# Patient Record
Sex: Female | Born: 1935 | Race: White | Hispanic: No | Smoking: Never smoker
Health system: Southern US, Community
[De-identification: ages and names within clinical notes are randomized; demographics above are authoritative.]

## PROBLEM LIST (undated history)

## (undated) DIAGNOSIS — F32A Depression, unspecified: Secondary | ICD-10-CM

## (undated) DIAGNOSIS — Z8719 Personal history of other diseases of the digestive system: Secondary | ICD-10-CM

## (undated) DIAGNOSIS — F329 Major depressive disorder, single episode, unspecified: Secondary | ICD-10-CM

## (undated) DIAGNOSIS — M1990 Unspecified osteoarthritis, unspecified site: Secondary | ICD-10-CM

## (undated) DIAGNOSIS — G629 Polyneuropathy, unspecified: Secondary | ICD-10-CM

## (undated) DIAGNOSIS — R531 Weakness: Secondary | ICD-10-CM

## (undated) DIAGNOSIS — F419 Anxiety disorder, unspecified: Secondary | ICD-10-CM

## (undated) DIAGNOSIS — R0602 Shortness of breath: Secondary | ICD-10-CM

## (undated) DIAGNOSIS — R002 Palpitations: Secondary | ICD-10-CM

## (undated) DIAGNOSIS — I482 Chronic atrial fibrillation, unspecified: Secondary | ICD-10-CM

## (undated) DIAGNOSIS — R6889 Other general symptoms and signs: Secondary | ICD-10-CM

## (undated) DIAGNOSIS — R5383 Other fatigue: Secondary | ICD-10-CM

## (undated) HISTORY — DX: Weakness: R53.1

## (undated) HISTORY — DX: Polyneuropathy, unspecified: G62.9

## (undated) HISTORY — DX: Chronic atrial fibrillation, unspecified: I48.20

## (undated) HISTORY — PX: APPENDECTOMY: SHX54

## (undated) HISTORY — DX: Personal history of other diseases of the digestive system: Z87.19

## (undated) HISTORY — PX: VAGINAL HYSTERECTOMY: SUR661

## (undated) HISTORY — DX: Depression, unspecified: F32.A

## (undated) HISTORY — DX: Major depressive disorder, single episode, unspecified: F32.9

## (undated) HISTORY — DX: Shortness of breath: R06.02

## (undated) HISTORY — DX: Palpitations: R00.2

## (undated) HISTORY — DX: Unspecified osteoarthritis, unspecified site: M19.90

## (undated) HISTORY — PX: TONSILLECTOMY: SUR1361

## (undated) HISTORY — DX: Other general symptoms and signs: R68.89

## (undated) HISTORY — DX: Anxiety disorder, unspecified: F41.9

## (undated) HISTORY — PX: UMBILICAL HERNIA REPAIR: SHX196

## (undated) HISTORY — DX: Other fatigue: R53.83

## (undated) HISTORY — PX: KNEE SURGERY: SHX244

---

## 1997-11-10 ENCOUNTER — Other Ambulatory Visit: Admission: RE | Admit: 1997-11-10 | Discharge: 1997-11-10 | Payer: Self-pay | Admitting: Gynecology

## 1998-04-21 ENCOUNTER — Encounter: Payer: Self-pay | Admitting: Orthopedic Surgery

## 1998-04-29 ENCOUNTER — Encounter: Payer: Self-pay | Admitting: Orthopedic Surgery

## 1998-04-29 ENCOUNTER — Inpatient Hospital Stay (HOSPITAL_COMMUNITY): Admission: RE | Admit: 1998-04-29 | Discharge: 1998-05-04 | Payer: Self-pay | Admitting: Orthopedic Surgery

## 1998-05-04 ENCOUNTER — Inpatient Hospital Stay (HOSPITAL_COMMUNITY)
Admission: RE | Admit: 1998-05-04 | Discharge: 1998-05-11 | Payer: Self-pay | Admitting: Physical Medicine and Rehabilitation

## 1998-05-11 ENCOUNTER — Encounter
Admission: RE | Admit: 1998-05-11 | Discharge: 1998-06-24 | Payer: Self-pay | Admitting: Physical Medicine and Rehabilitation

## 1998-10-22 ENCOUNTER — Encounter: Admission: RE | Admit: 1998-10-22 | Discharge: 1998-10-22 | Payer: Self-pay | Admitting: Gastroenterology

## 1998-11-24 ENCOUNTER — Encounter: Payer: Self-pay | Admitting: Internal Medicine

## 1998-11-24 ENCOUNTER — Ambulatory Visit (HOSPITAL_COMMUNITY): Admission: RE | Admit: 1998-11-24 | Discharge: 1998-11-24 | Payer: Self-pay | Admitting: Internal Medicine

## 1998-12-19 ENCOUNTER — Emergency Department (HOSPITAL_COMMUNITY): Admission: EM | Admit: 1998-12-19 | Discharge: 1998-12-19 | Payer: Self-pay | Admitting: Emergency Medicine

## 1999-02-24 ENCOUNTER — Other Ambulatory Visit: Admission: RE | Admit: 1999-02-24 | Discharge: 1999-02-24 | Payer: Self-pay | Admitting: Gynecology

## 1999-09-06 ENCOUNTER — Encounter: Payer: Self-pay | Admitting: Neurosurgery

## 1999-09-06 ENCOUNTER — Encounter: Admission: RE | Admit: 1999-09-06 | Discharge: 1999-09-06 | Payer: Self-pay | Admitting: Neurosurgery

## 1999-09-11 ENCOUNTER — Encounter: Payer: Self-pay | Admitting: Neurosurgery

## 1999-09-11 ENCOUNTER — Ambulatory Visit (HOSPITAL_COMMUNITY): Admission: RE | Admit: 1999-09-11 | Discharge: 1999-09-11 | Payer: Self-pay | Admitting: Neurosurgery

## 1999-10-05 ENCOUNTER — Encounter: Admission: RE | Admit: 1999-10-05 | Discharge: 1999-10-05 | Payer: Self-pay | Admitting: Neurosurgery

## 1999-10-05 ENCOUNTER — Encounter: Payer: Self-pay | Admitting: Neurosurgery

## 1999-12-26 ENCOUNTER — Encounter: Payer: Self-pay | Admitting: Neurosurgery

## 1999-12-26 ENCOUNTER — Encounter: Admission: RE | Admit: 1999-12-26 | Discharge: 1999-12-26 | Payer: Self-pay | Admitting: Neurosurgery

## 2000-01-21 ENCOUNTER — Encounter: Payer: Self-pay | Admitting: Neurosurgery

## 2000-01-21 ENCOUNTER — Ambulatory Visit (HOSPITAL_COMMUNITY): Admission: RE | Admit: 2000-01-21 | Discharge: 2000-01-21 | Payer: Self-pay | Admitting: Neurosurgery

## 2000-03-22 ENCOUNTER — Encounter (INDEPENDENT_AMBULATORY_CARE_PROVIDER_SITE_OTHER): Payer: Self-pay | Admitting: *Deleted

## 2000-03-22 ENCOUNTER — Ambulatory Visit (HOSPITAL_COMMUNITY): Admission: RE | Admit: 2000-03-22 | Discharge: 2000-03-22 | Payer: Self-pay | Admitting: Gastroenterology

## 2000-03-22 ENCOUNTER — Encounter: Payer: Self-pay | Admitting: Gastroenterology

## 2000-05-03 ENCOUNTER — Encounter: Payer: Self-pay | Admitting: Neurosurgery

## 2000-05-03 ENCOUNTER — Ambulatory Visit (HOSPITAL_COMMUNITY): Admission: RE | Admit: 2000-05-03 | Discharge: 2000-05-03 | Payer: Self-pay | Admitting: Neurosurgery

## 2000-07-02 ENCOUNTER — Other Ambulatory Visit: Admission: RE | Admit: 2000-07-02 | Discharge: 2000-07-02 | Payer: Self-pay | Admitting: Gynecology

## 2001-05-30 ENCOUNTER — Encounter: Admission: RE | Admit: 2001-05-30 | Discharge: 2001-05-30 | Payer: Self-pay | Admitting: Internal Medicine

## 2001-05-30 ENCOUNTER — Encounter: Payer: Self-pay | Admitting: Internal Medicine

## 2001-09-12 ENCOUNTER — Ambulatory Visit: Admission: RE | Admit: 2001-09-12 | Discharge: 2001-09-12 | Payer: Self-pay | Admitting: Internal Medicine

## 2002-04-10 ENCOUNTER — Emergency Department (HOSPITAL_COMMUNITY): Admission: EM | Admit: 2002-04-10 | Discharge: 2002-04-10 | Payer: Self-pay

## 2002-08-07 HISTORY — PX: US ECHOCARDIOGRAPHY: HXRAD669

## 2003-01-30 ENCOUNTER — Ambulatory Visit (HOSPITAL_COMMUNITY): Admission: RE | Admit: 2003-01-30 | Discharge: 2003-01-30 | Payer: Self-pay | Admitting: Neurosurgery

## 2003-02-23 ENCOUNTER — Ambulatory Visit (HOSPITAL_COMMUNITY): Admission: RE | Admit: 2003-02-23 | Discharge: 2003-02-23 | Payer: Self-pay | Admitting: Neurosurgery

## 2003-04-27 ENCOUNTER — Ambulatory Visit (HOSPITAL_COMMUNITY): Admission: RE | Admit: 2003-04-27 | Discharge: 2003-04-27 | Payer: Self-pay | Admitting: Neurosurgery

## 2003-05-19 ENCOUNTER — Encounter: Admission: RE | Admit: 2003-05-19 | Discharge: 2003-05-19 | Payer: Self-pay | Admitting: Internal Medicine

## 2003-08-03 ENCOUNTER — Ambulatory Visit (HOSPITAL_COMMUNITY): Admission: RE | Admit: 2003-08-03 | Discharge: 2003-08-03 | Payer: Self-pay | Admitting: Neurosurgery

## 2003-09-02 ENCOUNTER — Encounter: Admission: RE | Admit: 2003-09-02 | Discharge: 2003-09-02 | Payer: Self-pay | Admitting: Orthopedic Surgery

## 2003-09-03 ENCOUNTER — Ambulatory Visit (HOSPITAL_COMMUNITY): Admission: RE | Admit: 2003-09-03 | Discharge: 2003-09-03 | Payer: Self-pay | Admitting: Orthopedic Surgery

## 2003-09-03 ENCOUNTER — Ambulatory Visit (HOSPITAL_BASED_OUTPATIENT_CLINIC_OR_DEPARTMENT_OTHER): Admission: RE | Admit: 2003-09-03 | Discharge: 2003-09-03 | Payer: Self-pay | Admitting: Orthopedic Surgery

## 2004-03-24 ENCOUNTER — Encounter: Admission: RE | Admit: 2004-03-24 | Discharge: 2004-03-24 | Payer: Self-pay | Admitting: Cardiology

## 2004-06-13 ENCOUNTER — Encounter: Admission: RE | Admit: 2004-06-13 | Discharge: 2004-06-13 | Payer: Self-pay | Admitting: Internal Medicine

## 2004-10-07 ENCOUNTER — Ambulatory Visit: Admission: RE | Admit: 2004-10-07 | Discharge: 2004-10-07 | Payer: Self-pay | Admitting: Orthopedic Surgery

## 2005-01-18 ENCOUNTER — Encounter: Admission: RE | Admit: 2005-01-18 | Discharge: 2005-01-18 | Payer: Self-pay | Admitting: Gastroenterology

## 2005-12-27 ENCOUNTER — Encounter: Admission: RE | Admit: 2005-12-27 | Discharge: 2005-12-27 | Payer: Self-pay | Admitting: *Deleted

## 2006-01-09 ENCOUNTER — Ambulatory Visit (HOSPITAL_COMMUNITY): Admission: RE | Admit: 2006-01-09 | Discharge: 2006-01-09 | Payer: Self-pay | Admitting: Family Medicine

## 2006-04-27 ENCOUNTER — Inpatient Hospital Stay (HOSPITAL_COMMUNITY): Admission: EM | Admit: 2006-04-27 | Discharge: 2006-04-30 | Payer: Self-pay | Admitting: *Deleted

## 2006-04-30 ENCOUNTER — Encounter: Payer: Self-pay | Admitting: Vascular Surgery

## 2006-04-30 ENCOUNTER — Encounter (INDEPENDENT_AMBULATORY_CARE_PROVIDER_SITE_OTHER): Payer: Self-pay | Admitting: Cardiology

## 2006-04-30 ENCOUNTER — Ambulatory Visit: Payer: Self-pay | Admitting: Vascular Surgery

## 2006-07-11 ENCOUNTER — Encounter: Admission: RE | Admit: 2006-07-11 | Discharge: 2006-07-11 | Payer: Self-pay | Admitting: Orthopaedic Surgery

## 2006-07-27 ENCOUNTER — Encounter: Admission: RE | Admit: 2006-07-27 | Discharge: 2006-07-27 | Payer: Self-pay | Admitting: Orthopaedic Surgery

## 2006-09-05 ENCOUNTER — Encounter: Admission: RE | Admit: 2006-09-05 | Discharge: 2006-09-05 | Payer: Self-pay | Admitting: Gastroenterology

## 2006-09-30 ENCOUNTER — Inpatient Hospital Stay (HOSPITAL_COMMUNITY): Admission: EM | Admit: 2006-09-30 | Discharge: 2006-10-03 | Payer: Self-pay | Admitting: *Deleted

## 2007-09-23 ENCOUNTER — Encounter: Admission: RE | Admit: 2007-09-23 | Discharge: 2007-09-23 | Payer: Self-pay | Admitting: Neurology

## 2009-09-02 ENCOUNTER — Inpatient Hospital Stay (HOSPITAL_COMMUNITY): Admission: EM | Admit: 2009-09-02 | Discharge: 2009-09-08 | Payer: Self-pay | Admitting: Emergency Medicine

## 2009-09-21 ENCOUNTER — Ambulatory Visit: Payer: Self-pay | Admitting: Cardiology

## 2010-01-24 ENCOUNTER — Ambulatory Visit: Payer: Self-pay | Admitting: Cardiology

## 2010-01-27 ENCOUNTER — Inpatient Hospital Stay (HOSPITAL_COMMUNITY)
Admission: EM | Admit: 2010-01-27 | Discharge: 2010-02-02 | Payer: Self-pay | Source: Home / Self Care | Attending: Internal Medicine | Admitting: Internal Medicine

## 2010-01-29 ENCOUNTER — Encounter: Payer: Self-pay | Admitting: Neurosurgery

## 2010-01-30 ENCOUNTER — Encounter: Payer: Self-pay | Admitting: Gastroenterology

## 2010-01-30 ENCOUNTER — Encounter (HOSPITAL_BASED_OUTPATIENT_CLINIC_OR_DEPARTMENT_OTHER): Payer: Self-pay | Admitting: Internal Medicine

## 2010-01-30 ENCOUNTER — Encounter: Payer: Self-pay | Admitting: Internal Medicine

## 2010-01-31 LAB — URINALYSIS, ROUTINE W REFLEX MICROSCOPIC
Bilirubin Urine: NEGATIVE
Hgb urine dipstick: NEGATIVE
Ketones, ur: NEGATIVE mg/dL
Specific Gravity, Urine: 1.007 (ref 1.005–1.030)
Urobilinogen, UA: 0.2 mg/dL (ref 0.0–1.0)

## 2010-01-31 LAB — CBC
HCT: 35.9 % — ABNORMAL LOW (ref 36.0–46.0)
Hemoglobin: 11.6 g/dL — ABNORMAL LOW (ref 12.0–15.0)
MCH: 31.4 pg (ref 26.0–34.0)
MCHC: 32.8 g/dL (ref 30.0–36.0)
MCHC: 32.3 g/dL (ref 30.0–36.0)
MCV: 95.9 fL (ref 78.0–100.0)
Platelets: 144 10*3/uL — ABNORMAL LOW (ref 150–400)
RBC: 3.73 MIL/uL — ABNORMAL LOW (ref 3.87–5.11)
RDW: 15.5 % (ref 11.5–15.5)
WBC: 4.9 10*3/uL (ref 4.0–10.5)

## 2010-01-31 LAB — DIFFERENTIAL
Basophils Relative: 0 % (ref 0–1)
Basophils Relative: 0 % (ref 0–1)
Eosinophils Absolute: 0.1 10*3/uL (ref 0.0–0.7)
Eosinophils Relative: 3 % (ref 0–5)
Lymphocytes Relative: 26 % (ref 12–46)
Lymphs Abs: 1.2 10*3/uL (ref 0.7–4.0)
Monocytes Absolute: 0.8 10*3/uL (ref 0.1–1.0)
Monocytes Absolute: 0.7 10*3/uL (ref 0.1–1.0)
Monocytes Relative: 16 % — ABNORMAL HIGH (ref 3–12)
Monocytes Relative: 13 % — ABNORMAL HIGH (ref 3–12)
Neutro Abs: 2.8 10*3/uL (ref 1.7–7.7)

## 2010-01-31 LAB — COMPREHENSIVE METABOLIC PANEL
ALT: 18 U/L (ref 0–35)
AST: 23 U/L (ref 0–37)
Alkaline Phosphatase: 78 U/L (ref 39–117)
Alkaline Phosphatase: 81 U/L (ref 39–117)
BUN: 13 mg/dL (ref 6–23)
CO2: 28 mEq/L (ref 19–32)
Calcium: 9.7 mg/dL (ref 8.4–10.5)
Chloride: 104 mEq/L (ref 96–112)
Chloride: 107 mEq/L (ref 96–112)
Creatinine, Ser: 0.81 mg/dL (ref 0.4–1.2)
GFR calc Af Amer: >60 mL/min (ref >60)
GFR calc non Af Amer: 55 mL/min — ABNORMAL LOW (ref >60)
Glucose, Bld: 112 mg/dL — ABNORMAL HIGH (ref 70–99)
Potassium: 3.9 mEq/L (ref 3.5–5.1)
Potassium: 3.8 mEq/L (ref 3.5–5.1)
Sodium: 140 mEq/L (ref 135–145)
Total Bilirubin: 1.2 mg/dL (ref 0.3–1.2)
Total Protein: 7 g/dL (ref 6.0–8.3)

## 2010-01-31 LAB — POCT I-STAT, CHEM 8
BUN: 17 mg/dL (ref 6–23)
Calcium, Ion: 1.25 mmol/L (ref 1.12–1.32)
Chloride: 104 mEq/L (ref 96–112)
Glucose, Bld: 112 mg/dL — ABNORMAL HIGH (ref 70–99)
TCO2: 30 mmol/L (ref 0–100)

## 2010-01-31 LAB — LIPID PANEL: VLDL: 19 mg/dL (ref 0–40)

## 2010-01-31 LAB — TSH: TSH: 1.331 u[IU]/mL (ref 0.350–4.500)

## 2010-01-31 LAB — PROTIME-INR
INR: 1.09 (ref 0.00–1.49)
Prothrombin Time: 14.5 seconds (ref 11.6–15.2)
Prothrombin Time: 14.3 seconds (ref 11.6–15.2)

## 2010-01-31 LAB — HEMOGLOBIN A1C
Hgb A1c MFr Bld: 6.1 % — ABNORMAL HIGH (ref <5.7)
Mean Plasma Glucose: 128 mg/dL — ABNORMAL HIGH (ref <117)

## 2010-01-31 LAB — GLUCOSE, CAPILLARY

## 2010-01-31 LAB — CK TOTAL AND CKMB (NOT AT ARMC)
Relative Index: INVALID (ref 0.0–2.5)
Total CK: 96 U/L (ref 7–177)

## 2010-01-31 LAB — APTT: aPTT: 29 seconds (ref 24–37)

## 2010-01-31 LAB — TROPONIN I: Troponin I: 0.05 ng/mL (ref 0.00–0.06)

## 2010-02-01 LAB — HEPARIN LEVEL (UNFRACTIONATED)
Heparin Unfractionated: 0.25 IU/mL — ABNORMAL LOW (ref 0.30–0.70)
Heparin Unfractionated: 0.23 IU/mL — ABNORMAL LOW (ref 0.30–0.70)
Heparin Unfractionated: 0.29 IU/mL — ABNORMAL LOW (ref 0.30–0.70)

## 2010-02-01 LAB — CBC
Platelets: 145 10*3/uL — ABNORMAL LOW (ref 150–400)
Platelets: 151 10*3/uL (ref 150–400)
RBC: 3.61 MIL/uL — ABNORMAL LOW (ref 3.87–5.11)
RDW: 15.5 % (ref 11.5–15.5)
WBC: 5.6 10*3/uL (ref 4.0–10.5)
WBC: 6.5 10*3/uL (ref 4.0–10.5)

## 2010-02-01 LAB — PROTIME-INR: INR: 1.27 (ref 0.00–1.49)

## 2010-02-02 LAB — PROTIME-INR
INR: 1.21 (ref 0.00–1.49)
Prothrombin Time: 15.5 seconds — ABNORMAL HIGH (ref 11.6–15.2)

## 2010-02-02 LAB — CBC
Hemoglobin: 11.8 g/dL — ABNORMAL LOW (ref 12.0–15.0)
MCH: 31.4 pg (ref 26.0–34.0)
MCHC: 33 g/dL (ref 30.0–36.0)
Platelets: 157 10*3/uL (ref 150–400)
RBC: 3.76 MIL/uL — ABNORMAL LOW (ref 3.87–5.11)
RBC: 3.81 MIL/uL — ABNORMAL LOW (ref 3.87–5.11)
WBC: 6.5 10*3/uL (ref 4.0–10.5)

## 2010-02-02 LAB — HEPARIN LEVEL (UNFRACTIONATED): Heparin Unfractionated: 0.36 IU/mL (ref 0.30–0.70)

## 2010-02-16 NOTE — H&P (Signed)
[name], [name]         ACCOUNT NO.:  [id number]  MEDICAL RECORD NO.:  [id number]          PATIENT TYPE:  INP  LOCATION:  3016                         FACILITY:  [facility]  PHYSICIAN:  Barry Dienes. Eloise Harman, M.D.DATE OF BIRTH:  1911/01/12  DATE OF ADMISSION:  01/27/2010 DATE OF DISCHARGE:                             HISTORY & PHYSICAL   CHIEF COMPLAINT:  Cannot speak.  HISTORY OF PRESENT ILLNESS:  The patient is a 77 year old white woman who is well known to me.  She is a resident of the [facility], who today pulled her call button in her room, was found mute with a right facial droop, and was transferred to the emergency room via ambulance.  She has a history of chronic atrial fibrillation and had been on Pradaxa until a GI bleed in October 2001. At that time, due to her concern about a re-bleed and her frustration with dose adjustments with Coumadin, she opted to switch to aspirin once daily.  Currently, she cannot provide more recent history due to expressive aphasia.  PAST MEDICAL HISTORY: 1. Hypothyroidism. 2. Hiatal hernia. 3. Gastroesophageal reflux. 4. Chronic low back pain with spinal stenosis at L3-4. 5. Chronic atrial fibrillation. 6. Osteoporosis with T12-L1 compression fractures. 7. Osteoarthritis. 8. Hemorrhoids. 9. Vague history of stroke. 10.Gait unsteadiness, for which she uses a walker. 11.Chronic fatigue. 12.Cough. 13.Congestive heart failure.  MEDICATIONS AT THAT TIME OF ADMISSION: 1. Ecotrin 81 mg p.o. daily. 2. GlycoLax  17 g p.o. daily p.r.n. constipation. 3. Zydone 5/325 one tab p.o. 4 times daily as needed for moderate     pain. 4. Alprazolam 0.25 mg p.o. at bedtime p.r.n. sleep or anxiety. 5. Gabapentin 300 mg p.o. t.i.d. 6. Hydrochlorothiazide 25 mg p.o. daily. 7. Lisinopril 20 mg p.o. daily. 8. Meloxicam 15 mg p.o. daily. 9. Percocet 5/325 one tab p.o. t.i.d. as needed for severe pain. 10.Protonix 40 mg  daily. 11.Synthroid 75 mcg daily.  ALLERGIES:  CYMBALTA.  PHYSICIANS INVOLVED IN CARE:  Colleen Can. Deborah Chalk, MD; Llana Aliment. Randa Evens, MD; and Dr. Morrell Riddle.  PAST SURGICAL HISTORY:  Remote appendectomy, remote umbilical hernia repair, left total knee replacement, and bilateral cataract operations.  SOCIAL HISTORY:  She is a widow and lives at the independent living section of [place].  She is retired.  She has no history of tobacco or alcohol abuse.  FAMILY HISTORY:  Her father died at age 32 from cancer.  Her mother died at age 31.  She had 5 brothers who have died and there is one sister who is alive.  The family history is positive for premature cardiovascular disease, diabetes mellitus, and colon cancer.  She has 2 sons and 2 daughters.  REVIEW OF SYSTEMS:  She has chronic mild-to-moderate low back pain and generally walks leaning forward using a walker for stability.  She has not had recent problems with shortness of breath or chest pain or nausea or vomiting.  Currently, she is unable to speak.  CURRENT PHYSICAL EXAMINATION:  VITAL SIGNS:  Blood pressure 170/90, pulse 100, respirations 20, temperature 98.9, and pulse oxygen saturation was 97% on room air. GENERAL:  She is an elderly white  woman who had a right facial droop and was in no apparent distress while lying partially upright in bed. HEAD, EYES, EARS, NOSE, AND THROAT:  Significant for a moderate right facial droop. NECK:  Supple and without jugular venous distention or carotid bruit. CHEST:  Clear to auscultation. HEART:  Irregularly irregular rhythm without significant murmur. ABDOMEN:  Normal bowel sounds.  No hepatosplenomegaly or tenderness. EXTREMITIES:  Without cyanosis, clubbing, or edema. NEUROLOGIC:  She is alert and mute.  She had a right facial droop.  She was able to move all extremities well and would not follow commands.  INITIAL LABORATORY AND DIAGNOSTIC STUDIES:  EKG showed  atrial fibrillation at a rate of 69.  Serum sodium was 142, potassium 3.9, chloride 104, glucose 112, BUN 17, and creatinine 1.1.  White blood cell count was 4.8, hemoglobin 11.6, hematocrit 35.4, and platelets 144. Liver associated enzymes were normal.  Total protein 7.1, albumin 3.9, and calcium 9.7.  Troponin I was 0.04.  Urinalysis was nitrite negative. CPK total was 96 with MB 4.5.  Hemoglobin A1c was 6.1%.  CT scan of the brain without contrast showed no acute intracranial infarction, hemorrhage, or mass lesion with extensive periventricular white matter lucency consistent with chronic small vessel ischemic disease and there were several tiny old lacunar infarcts in the basal ganglia.  An MRI exam of the brain done without contrast showed an acute infarct in the left frontal temporal lobe with chronic ischemic change in the pons and basal ganglia bilaterally.  An MRA exam of the brain showed mild intracranial atherosclerotic disease without large vessel occlusion.  IMPRESSION AND PLAN: 1. Acute stroke:  She has had a left brain stroke, likely from cardiac     embolism.  Unfortunately, because of her recent gastrointestinalbleed, she opted to not continue Pradaxa or Coumadin and switched     to aspirin with slightly lessened effectiveness with stroke     prophylaxis with atrial fibrillation.  She has fairly preserved     right-sided strength; however, she has expressive aphasia.  She     likely has some degree of dysphagia, and we will hold feeding and     pill administration until she has been cleared by speech language     pathologist.  We will also have her see a physical therapist,     occupational therapist, and obtain other stroke diagnostic tests     including an echocardiogram and a carotid ultrasound exam.  She     likely will need skilled nursing facility rehabilitation.  Should     she have significant dysphagia, we will need to consider the     possibility of PEG tube  placement.  During her last admission, she     had opted for a do not resuscitate status.  We will continue a do     not resuscitate     status per her previous wishes as she cannot speak at this time. 2. Atrial fibrillation:  Stable on current medications. 3. Hypothyroidism:  Clinically stable and we will recheck a TSH level.          ______________________________ Barry Dienes. Eloise Harman, M.D.     DGP/MEDQ  D:  01/29/2010  T:  01/29/2010  Job:  [id number]  Electronically Signed by Jarome Matin M.D. on 02/16/2010 07:56:02 AM

## 2010-02-16 NOTE — Consult Note (Signed)
[name], [name]         ACCOUNT NO.:  [id number]  MEDICAL RECORD NO.:  [id number]          PATIENT TYPE:  INP  LOCATION:  3016                         FACILITY:  [facility]  PHYSICIAN:  Dr. Terrace Arabia             .DATE OF BIRTH:  [birth year]  DATE OF CONSULTATION:  01/27/2010 DATE OF DISCHARGE:                                CONSULTATION   REASON FOR CONSULTATION:  Stroke.  HISTORY OF PRESENT ILLNESS:  This is a pleasant 77 year old female with past medical history of hypothyroidism, hiatal hernia, GERD, chronic low back pain, chronic atrial fibrillation, previously on Coumadin for the past 5-6 years.  However, recently was taken off Coumadin, placed on Pradaxa for 2-3 days but then suffered rectal bleeding and has been on aspirin since that point in time.  The patient lives in independent living center where she takes care of her daily living and lives fairly independent.  The patient was last seen normal at 8:30 p.m. on August 26, 2009.  At approximately 9:45 in the morning, the patient pushed her help button and was found standing at her sink mute.  Her family members were in the room.  When EMS go to the scene, she was clenching onto the sink.  It was very difficult to get her in the stretcher.  It was immediately noticeable that the patient had a left gaze preference, right facial droop and was mute, however she was moving all 4 extremities.  The patient was brought in the [facility] Emergency Department where a CT scan was obtained and she was found to have no acute infarct.  The patient remains mute with a left gaze preference, right hemianopsia, right facial droop and appears to be moving her left side more than her right.  PAST SURGICAL HISTORY:  Appendectomy, umbilical hernia repair, left TKA, bilateral iridectomy and cataract repair.  PAST MEDICAL HISTORY:  Chronic fatigue, cough, congestive heart failure, hiatal hernia, GERD, chronic low back pain, spinal stenosis at  L3-4, AFib, as stated recently on Coumadin and Pradaxa, however secondary to rectal bleeding, has only been on Ecotrin 325 daily, T12 and L1 fracture.  MEDICATIONS:  She is on amoxicillin 500 mg, hydrochlorothiazide, lisinopril, meloxicam, methocarbamol, pantoprazole, Synthroid, alprazolam, gabapentin, Norco, Poly-Iron, Spiriva, and Ecotrin.  ALLERGIES:  She is allergic to BONIVA, FENTANYL, and LYRICA.  SOCIAL HISTORY:  She does not smoke, drink or do illicit drugs.  She is widowed.  She lives fairly independent.  REVIEW OF SYSTEMS:  Unobtainable as the patient is mute.  PHYSICAL EXAMINATION:  Blood pressure is 176/76, pulse 119, respirations 12.  She is alert, but she is unable to communicate.  She is very agitated at this time.  She moves her left extremity greater than her right extremity.  I cannot get her to do simple commands such as show me her thumb or close her eyes.  She does seem to track me as I cross the room.  Pupils are equal, round and reactive to light and accommodating. She does have a left gaze forced deviation with a right homonymous hemianopsia.  She has a positive facial droop at the  corner of her mouth and nasolabial fold and she is mute.  Facial sensation appears to be decreased to pinprick on the right lower portion of her upper jaw.  At this point she is moving bilateral upper extremities, left greater than right but appears to have 4/5 strength.  Grips are equal bilaterally. The patient is moving her lower extremities spontaneously but not to command.  She withdraws briskly to painful stimuli at the toes.  As far as sensation, the patient's left upper extremity shows brisk response to painful stimulation, her right upper extremity shows decreased response to painful stimulation and bilateral toes show brisk response to nail bed pressure.  It is difficult to assess ataxia at the time but when moving, I did not see any ataxic movements and she is sitting up  in the bed, agitated and shows no truncal ataxia at this time.  LABORATORY FINDINGS:  UA is negative.  Sodium 140, potassium 2.9, chloride 104, CO2 28, BUN 16, creatinine 0.95, glucose 113, white blood cell count 4.8,  platelets 144, hemoglobin 11.6, hematocrit 35.4.  IMAGING:  CT of head is negative for any mass, bleed or infarct.  Does shows moderate atrophy.  ASSESSMENT:  This is a 77 year old Caucasian female with a history of atrial fibrillation, questionable transient ischemic attacks in the past, recently on Coumadin and then Pradaxa, but both were removed secondary to rectal bleeding.  She has been on Ecotrin 325 since September 08, 2009.  The patient now presents to the emergency room with right hemianopsia, right facial droop, left gaze deviation and mute.  This likely will represent a left brain CVA from a cardioembolic source.  We will continue with a stroke workup to look for source while she is in the hospital and stroke MD will follow in the morning.  RECOMMENDATIONS:  MRI, MRA of brain, 2-D echo, carotid Doppler, fasting lipid pane, HbA1c, PT, OT, speech therapy, n.p.o. until passes stroke swallow screen.     Felicie Morn, PA-C   ______________________________ Dr. Terrace Arabia    DS/MEDQ  D:  01/27/2010  T:  01/28/2010  Job:  [id number]  cc:   Levert Feinstein, MD  Electronically Signed by Felicie Morn PA-C on 01/28/2010 03:24:41 PM Electronically Signed by Jarome Matin M.D. on 02/16/2010 07:55:59 AM

## 2010-02-16 NOTE — Discharge Summary (Signed)
[name], [name]         ACCOUNT NO.:  [id number]  MEDICAL RECORD NO.:  [id number]          PATIENT TYPE:  INP  LOCATION:  3016                         FACILITY:  [facility]  PHYSICIAN:  Barry Dienes. Eloise Harman, M.D.DATE OF BIRTH:  November 26, 1911  DATE OF ADMISSION:  01/27/2010 DATE OF DISCHARGE:                              DISCHARGE SUMMARY   ANTICIPATED DATE OF DISCHARGE:  February 02, 2010.  PERTINENT FINDINGS:  The patient is a 77 year old white woman who is well known to me.  She has been a resident of the [facility] independent living facility, and on the day of admission she pulled her call button in her room and was found to be with a right facial droop, so she was transferred to the emergency room via ambulance.  She has a history of chronic atrial fibrillation and had been on Pradaxa until a GI bleed in October 2011.  At a followup visit from that hospitalization, due to concern about a recurrent GI bleed and herfrustration with dose adjustments with Coumadin, she opted to switch to aspirin once daily as stroke prophylaxis rather than use Pradaxa or Coumadin.  At the time of my evaluation, she could not provide more recent history due to expressive aphasia.  PAST MEDICAL HISTORY:  Hypothyroidism, gastroesophageal reflux and hiatal hernia, chronic low back pain with spinal stenosis at the L3-4 level, chronic atrial fibrillation, osteoporosis with T12 and L1 compression fractures, osteoarthritis, hemorrhoids, vague history of stroke, gait unsteadiness for which she uses a walker, chronic fatigue, cough, and congestive heart failure.  MEDICATIONS:  At the time of hospital admission 1. Ecotrin 81 mg daily. 2. GlycoLax 17 g daily p.r.n. constipation. 3. Zydone 5/325 one tablet p.o. every 4 hours as needed for moderate     pain. 4. Alprazolam 0.25 mg at bedtime as needed for sleep or anxiety. 5. Gabapentin 300 mg p.o. t.i.d. 6. Hydrochlorothiazide 25 mg p.o. daily. 7.  Lisinopril 20 mg p.o. daily. 8. Meloxicam 15 mg p.o. daily. 9. Percocet 5/325 one tablet p.o. t.i.d. as needed for severe pain. 10.Protonix 40 mg daily. 11.Synthroid 75 mcg daily.  ALLERGIES:  CYMBALTA.  See admit history and physical for details of physicians involved in care, past surgical history, social history, family history, and review of systems.  INITIAL PHYSICAL EXAM:  VITAL SIGNS:  Blood pressure 170/90, pulse 100, respirations 20, temperature 98.9, pulse oxygen saturation was 97% on room air.  GENERAL:  The patient is an elderly white woman who had right facial droop and was in no apparent distress while lying partially upright in bed. HEAD, EYES, EARS, NOSE AND THROAT:  Significant for moderate right-sided facial droop. NECK:  Supple without jugular venous distention or carotid bruit. CHEST:  Clear to auscultation. HEART:  An irregularly irregular rhythm without significant murmur. ABDOMEN:  Normal bowel sounds and no hepatosplenomegaly or tenderness. EXTREMITIES:  Without cyanosis, clubbing, or edema. NEUROLOGIC:  She was alert and mute.  She had a moderate size right facial droop.  She was able to move all extremities well and would not follow commands.  INITIAL LABORATORY STUDIES:  EKG showed atrial fibrillation at a ventricular rate of 69.  Serum  sodium was 142, potassium 3.9, chloride 104, glucose 112, BUN 17, creatinine 1.1.  White blood cell count was 4.8, hemoglobin 11.6, hematocrit 35.4, and platelets 144.  Liver- associated enzymes were normal.  Total protein 7.1, albumin 3.9, calcium 9.7.  Troponin-I level was 0.04.  Urinalysis was nitrite negative.  CPK total was 96 with MB 4.5.  Hemoglobin A1c was 6.1%.  A CT scan of the brain without IV contrast showed no acute intracranial infection, hemorrhage, or mass lesion.  There were extensive periventricular white matter lucencies consistent with chronic small-vessel ischemic disease, and there were several  tiny old lacunar infarcts in the basal ganglia. Subsequently, an MRI exam of the brain was done without IV contrast that showed an acute infarct in the left frontotemporal lobe with chronic ischemic change in the pons and basal ganglia bilaterally.  An MRA exam of the brain showed mild intracranial atherosclerotic disease without large vessel occlusion.  HOSPITAL COURSE:  The patient was followed closely by physical therapy and occupational therapy and speech therapy consultants as well as a neurology Research scientist (medical).  Her initial swallow test showed that she was not able to swallow any food consistently safely at all, so she was changed to an n.p.o. status and continued with IV fluids.  She had a carotid ultrasound exam that showed no significant extracranial carotid artery stenosis.  She had a followup modified barium swallow done on January 23 that showed significant improvement with moderate oropharyngeal dysphagia with gross aspiration of thin liquids.  A trial of controlled volume of bolus via teaspoon was without aspiration, with thickened nectar-thick liquids.  Accordingly, the speech pathologist recommended using a dysphagia-1 and nectar-thick liquids diet.  Liquids should be given by teaspoon only, and there should be no use of cups or sipping. Medicines should be crushed, and she requires full support during feeding.  A transthoracic echocardiogram is to be done today, with results pending at the time of dictation.  PROCEDURES:  CT scan of the head without IV contrast, MRI of the head without IV contrast, MRA of the brain, modified barium swallow on January 20, modified barium swallow on January 23, and transthoracic echocardiogram on January 23.  COMPLICATIONS:  None.  CONDITION ON DISCHARGE:  She remains frustrated by difficulty communicating, although she is using a message board for people with aphasia, to some effect.  MOST RECENT PHYSICAL EXAMINATION:  VITAL SIGNS:   Blood pressure 156/77, pulse 78, respirations 16, temperature 98.1, pulse oxygen saturation 96% on room air.  GENERAL:  She is an elderly white woman who is in no apparent distress. HEAD, EYES, EARS, NOSE AND THROAT:  Within normal limits with the exception of the mild right-sided facial droop. NECK:  Without jugular venous distention or carotid bruit. CHEST:  Clear to auscultation. HEART:  An irregularly irregular rhythm. ABDOMEN:  Normal bowel sounds and no hepatosplenomegaly or tenderness. EXTREMITIES:  Without cyanosis, clubbing, or edema, and the pedal pulses were palpable. NEUROLOGIC:  This is most significant for a mild right-sided facial droop and expressive aphasia.  DISCHARGE DIAGNOSES: 1. Acute left brain stroke with resultant expressive aphasia. 2. Chronic atrial fibrillation for which she declined Coumadin and     uses aspirin for stroke prophylaxis. 3. Dyslipidemia. 4. Stroke prophylaxis. 5. Constipation. 6. Anticoagulation. 7. Osteoarthritis. 8. Anxiety. 9. Chronic low back pain. 10.Hypertension. 11.Constipation. 12.Iron deficiency anemia.  DISCHARGE MEDICATIONS: 1. Lovenox 30 mg subcutaneous once daily. 2. Fleet enema once daily as needed. 3. Simvastatin 5 mg p.o. daily. 4. Thick-It food  thickener, use as directed to achieve nectar-thick     liquids. 5. Warfarin 2.5 mg by mouth once daily. 6. Tylenol 325 mg, take 1 tablet p.o. every 4 hours as needed for pain     or temperature greater than 100 degrees Fahrenheit. 7. Xanax 0.25 mg, take 1 tablet p.o. q.i.d. p.r.n. anxiety. 8. Aspirin uncoated 325 mg p.o. once daily. 9. Calcium 1 tablet p.o. twice daily. 10.Gabapentin, take two caps p.o. every morning and 2 capsules p.o. at     bedtime. 11.Hydrochlorothiazide 25 mg p.o. daily. 12.Vicodin 5/325, take 1 tablet p.o. q.i.d. p.r.n. moderate pain. 13.Nu-Iron 150 one cap p.o. daily. 14.Lisinopril 20 mg p.o. daily. 15.MiraLax 17 g p.o. daily. 16.Protonix 40 mg  p.o. once daily. 17.Spiriva 18 mcg inhalation once daily. 18.Synthroid 75 mcg p.o. daily.  DISPOSITION AND FOLLOWUP:  The patient will be transferred to a skilled nursing facility for continued care of her gait instability and expressive aphasia.  When she completes her time in the skilled nursing facility and returns to a more community setting, she should call [phone] to arrange a followup visit with Dr. Jarome Matin at [facility] within 2 weeks of this discharge.  Please note that the process of discharge took 35 minutes.         ______________________________ Barry Dienes Eloise Harman, M.D.    DGP/MEDQ  D:  02/01/2010  T:  02/02/2010  Job:  [id number]  Electronically Signed by Jarome Matin M.D. on 02/16/2010 07:55:56 AM

## 2010-03-25 LAB — CBC
HCT: 24.9 % — ABNORMAL LOW (ref 36.0–46.0)
HCT: 25.9 % — ABNORMAL LOW (ref 36.0–46.0)
HCT: 29.1 % — ABNORMAL LOW (ref 36.0–46.0)
HCT: 29.1 % — ABNORMAL LOW (ref 36.0–46.0)
Hemoglobin: 8.1 g/dL — ABNORMAL LOW (ref 12.0–15.0)
Hemoglobin: 8.4 g/dL — ABNORMAL LOW (ref 12.0–15.0)
Hemoglobin: 9 g/dL — ABNORMAL LOW (ref 12.0–15.0)
MCH: 32.7 pg (ref 26.0–34.0)
MCH: 33 pg (ref 26.0–34.0)
MCH: 33.2 pg (ref 26.0–34.0)
MCH: 33.2 pg (ref 26.0–34.0)
MCHC: 33 g/dL (ref 30.0–36.0)
MCHC: 33 g/dL (ref 30.0–36.0)
MCHC: 33.1 g/dL (ref 30.0–36.0)
MCHC: 33.3 g/dL (ref 30.0–36.0)
MCHC: 33.3 g/dL (ref 30.0–36.0)
MCHC: 33.5 g/dL (ref 30.0–36.0)
MCHC: 33.6 g/dL (ref 30.0–36.0)
MCV: 100.1 fL — ABNORMAL HIGH (ref 78.0–100.0)
MCV: 99 fL (ref 78.0–100.0)
MCV: 99.5 fL (ref 78.0–100.0)
MCV: 99.9 fL (ref 78.0–100.0)
Platelets: 111 10*3/uL — ABNORMAL LOW (ref 150–400)
Platelets: 112 10*3/uL — ABNORMAL LOW (ref 150–400)
Platelets: 113 10*3/uL — ABNORMAL LOW (ref 150–400)
Platelets: 115 10*3/uL — ABNORMAL LOW (ref 150–400)
Platelets: 126 10*3/uL — ABNORMAL LOW (ref 150–400)
RBC: 2.29 MIL/uL — ABNORMAL LOW (ref 3.87–5.11)
RBC: 2.53 MIL/uL — ABNORMAL LOW (ref 3.87–5.11)
RBC: 2.75 MIL/uL — ABNORMAL LOW (ref 3.87–5.11)
RDW: 15.6 % — ABNORMAL HIGH (ref 11.5–15.5)
RDW: 15.8 % — ABNORMAL HIGH (ref 11.5–15.5)
RDW: 16.1 % — ABNORMAL HIGH (ref 11.5–15.5)
RDW: 16.4 % — ABNORMAL HIGH (ref 11.5–15.5)
RDW: 16.5 % — ABNORMAL HIGH (ref 11.5–15.5)
RDW: 17.5 % — ABNORMAL HIGH (ref 11.5–15.5)
WBC: 5.3 10*3/uL (ref 4.0–10.5)
WBC: 5.8 10*3/uL (ref 4.0–10.5)
WBC: 6.6 10*3/uL (ref 4.0–10.5)
WBC: 6.7 10*3/uL (ref 4.0–10.5)

## 2010-03-25 LAB — BASIC METABOLIC PANEL
BUN: 11 mg/dL (ref 6–23)
BUN: 15 mg/dL (ref 6–23)
BUN: 19 mg/dL (ref 6–23)
CO2: 23 mEq/L (ref 19–32)
CO2: 25 mEq/L (ref 19–32)
Calcium: 8.5 mg/dL (ref 8.4–10.5)
Calcium: 8.8 mg/dL (ref 8.4–10.5)
Calcium: 8.8 mg/dL (ref 8.4–10.5)
Calcium: 8.9 mg/dL (ref 8.4–10.5)
Creatinine, Ser: 0.74 mg/dL (ref 0.4–1.2)
Creatinine, Ser: 0.77 mg/dL (ref 0.4–1.2)
Creatinine, Ser: 0.97 mg/dL (ref 0.4–1.2)
GFR calc Af Amer: >60 mL/min (ref >60)
GFR calc Af Amer: >60 mL/min (ref >60)
GFR calc non Af Amer: >60 mL/min (ref >60)
GFR calc non Af Amer: >60 mL/min (ref >60)
GFR calc non Af Amer: >60 mL/min (ref >60)
Glucose, Bld: 102 mg/dL — ABNORMAL HIGH (ref 70–99)
Glucose, Bld: 111 mg/dL — ABNORMAL HIGH (ref 70–99)
Potassium: 4.7 mEq/L (ref 3.5–5.1)
Sodium: 140 mEq/L (ref 135–145)

## 2010-03-25 LAB — HEMOCCULT GUIAC POC 1CARD (OFFICE)
Fecal Occult Bld: NEGATIVE
Fecal Occult Bld: POSITIVE
Fecal Occult Bld: POSITIVE

## 2010-03-25 LAB — PREPARE FRESH FROZEN PLASMA

## 2010-03-25 LAB — PROTIME-INR
INR: 1.34 (ref 0.00–1.49)
INR: 2.94 — ABNORMAL HIGH (ref 0.00–1.49)
Prothrombin Time: 16.8 seconds — ABNORMAL HIGH (ref 11.6–15.2)
Prothrombin Time: 19 seconds — ABNORMAL HIGH (ref 11.6–15.2)
Prothrombin Time: 30.7 seconds — ABNORMAL HIGH (ref 11.6–15.2)

## 2010-03-25 LAB — HEPATIC FUNCTION PANEL
Bilirubin, Direct: 0.2 mg/dL (ref 0.0–0.3)
Indirect Bilirubin: 0.6 mg/dL (ref 0.3–0.9)

## 2010-03-25 LAB — URINALYSIS, ROUTINE W REFLEX MICROSCOPIC
Bilirubin Urine: NEGATIVE
Glucose, UA: NEGATIVE mg/dL
Ketones, ur: NEGATIVE mg/dL
pH: 5.5 (ref 5.0–8.0)

## 2010-03-25 LAB — TYPE AND SCREEN: ABO/RH(D): O POS

## 2010-03-25 LAB — COMPREHENSIVE METABOLIC PANEL
ALT: 14 U/L (ref 0–35)
AST: 15 U/L (ref 0–37)
Alkaline Phosphatase: 50 U/L (ref 39–117)
CO2: 27 mEq/L (ref 19–32)
Calcium: 8.5 mg/dL (ref 8.4–10.5)
GFR calc Af Amer: >60 mL/min (ref >60)
Potassium: 3.7 mEq/L (ref 3.5–5.1)
Sodium: 141 mEq/L (ref 135–145)
Total Protein: 5.6 g/dL — ABNORMAL LOW (ref 6.0–8.3)

## 2010-03-25 LAB — DIFFERENTIAL
Basophils Absolute: 0 10*3/uL (ref 0.0–0.1)
Basophils Relative: 0 % (ref 0–1)
Eosinophils Absolute: 0.1 10*3/uL (ref 0.0–0.7)
Eosinophils Relative: 1 % (ref 0–5)
Monocytes Absolute: 0.6 10*3/uL (ref 0.1–1.0)

## 2010-03-25 LAB — POCT I-STAT, CHEM 8
BUN: 39 mg/dL — ABNORMAL HIGH (ref 6–23)
Hemoglobin: 10.2 g/dL — ABNORMAL LOW (ref 12.0–15.0)
Potassium: 4.3 mEq/L (ref 3.5–5.1)
Sodium: 140 mEq/L (ref 135–145)
TCO2: 22 mmol/L (ref 0–100)

## 2010-03-25 LAB — URINE MICROSCOPIC-ADD ON

## 2010-03-25 LAB — URINE CULTURE

## 2010-03-25 LAB — TROPONIN I: Troponin I: 0.04 ng/mL (ref 0.00–0.06)

## 2010-03-25 LAB — CK TOTAL AND CKMB (NOT AT ARMC): Relative Index: INVALID (ref 0.0–2.5)

## 2010-05-07 ENCOUNTER — Emergency Department (HOSPITAL_COMMUNITY)
Admission: EM | Admit: 2010-05-07 | Discharge: 2010-05-07 | Disposition: A | Discharge status: Home / Self Care | Payer: Medicare Other | Attending: Emergency Medicine | Admitting: Emergency Medicine

## 2010-05-07 DIAGNOSIS — G589 Mononeuropathy, unspecified: Secondary | ICD-10-CM | POA: Insufficient documentation

## 2010-05-07 DIAGNOSIS — I4891 Unspecified atrial fibrillation: Secondary | ICD-10-CM | POA: Insufficient documentation

## 2010-05-07 DIAGNOSIS — Y92009 Unspecified place in unspecified non-institutional (private) residence as the place of occurrence of the external cause: Secondary | ICD-10-CM | POA: Insufficient documentation

## 2010-05-07 DIAGNOSIS — S139XXA Sprain of joints and ligaments of unspecified parts of neck, initial encounter: Secondary | ICD-10-CM | POA: Insufficient documentation

## 2010-05-07 DIAGNOSIS — R296 Repeated falls: Secondary | ICD-10-CM | POA: Insufficient documentation

## 2010-05-07 DIAGNOSIS — M542 Cervicalgia: Secondary | ICD-10-CM | POA: Insufficient documentation

## 2010-05-07 DIAGNOSIS — E039 Hypothyroidism, unspecified: Secondary | ICD-10-CM | POA: Insufficient documentation

## 2010-05-07 DIAGNOSIS — I10 Essential (primary) hypertension: Secondary | ICD-10-CM | POA: Insufficient documentation

## 2010-05-24 NOTE — H&P (Signed)
[name], [name]         ACCOUNT NO.:  [id number]   MEDICAL RECORD NO.:  [id number]          PATIENT TYPE:  INP   LOCATION:  5004                         FACILITY:  [facility]   PHYSICIAN:  Gaspar Garbe, M.D.DATE OF BIRTH:  1911/05/02   DATE OF ADMISSION:  09/30/2006  DATE OF DISCHARGE:                              HISTORY & PHYSICAL   CHIEF COMPLAINT:  Lower extremity weakness, cannot stay at independent  living.   HISTORY OF PRESENT ILLNESS:  The patient is a 77 year old white female  known to our practice only from Thursday when she established with Dr.  Eloise Harman.  Over the weekend she has had several episodes of falls with  her legs becoming more weak and was finally taken by family to the  emergency room after EMS had been out twice to come pick her up.  Clearly she cannot be cared for over at [facility] with her independent  living setting.   The patient indicates a history degenerative joints in her back, and in  fact, has had injections with steroids in her back approximately two  month ago for which she had to be off of Coumadin with Dr. Cleophas Dunker,  her orthopedist.  She has also seen Dr. Sandria Manly in the past for neuropathy  in her legs and has been intolerant of Cymbalta, but is currently taking  Neurontin twice daily.  She had been given a prescription for OxyContin  previously, but not been taking it and not been taking it in the past  couple days.  She prefers to use Vicodin just as needed.  Family is at  the bedside and indicates that they cannot watch her over the course of  the weekend.  We discussed her current level of care and indicated that  she would need an increase especially if it is so that she cannot get  around.  I did bring up the idea of moving her to a skilled nursing  facility, and they will consider this in the coming days.  The patient  currently has no acute pains or complaints, and has x-rays which were  negative for any acute findings of  fracture.   ALLERGIES:  Cymbalta which caused constipation.   MEDICATION:  Prilosec 20 mg daily, Actonel weekly, hydrochlorothiazide  12.5 mg daily, Neurontin 200 mg b.i.d. Synthroid 75 mcg daily, diltiazem  CD 240 mg daily, nitroglycerin p.r.n. lisinopril 10 mg daily, OxyContin  currently not used, Vicodin p.r.n., Coumadin 3 mg daily.  The patient  indicates recent level was good.  Lasix 40 mg daily.   PAST MEDICAL HISTORY:  1. Hypothyroidism.  2. Atrial fibrillation on Coumadin.  3. History of gastroesophageal reflux disease.  4. History of low back pain with spinal stenosis.  5. Osteoporosis.  6. Osteoarthritis.  7. History of hemorrhoids, seen per Dr. Randa Evens from GI and scoped      this year.   SOCIAL HISTORY:  The patient lives at [facility].  She is retired.  She is a nonsmoker, nondrinker and is widowed with four children.   FAMILY HISTORY:  Mother died at age 67.  Father died at age 57 of  cancer.  She has one sister who is currently still alive.   REVIEW OF SYSTEMS:  The patient denies any fevers, chills, sweats,  headaches, shortness of breath, chest pain, nausea, vomiting,  diaphoresis and changes in her bowel movements.  However, she does note  a chronic neuropathy in her legs and currently now cannot support her  own weight substantially with either leg.  Review of systems is  otherwise negative.   ADVANCED DIRECTIVE:  The patient is currently still full code.   PHYSICAL EXAMINATION:  VITAL SIGNS:  Temperature 97.9, pulse 85,  respiratory rate 18, blood pressure 114/56, sating 97% on room air.  GENERAL:  No acute distress.  HEENT:  Normocephalic, atraumatic.  PERRLA.  EOMI.  ENT is within normal  limits.  NECK:  Supple.  No lymphadenopathy.  JVD or bruit.  HEART:  Irregularly irregular consistent with atrial fibrillation.  ABDOMEN:  Soft, nontender, normoactive bowel sounds.  LUNGS:  Clear to auscultation bilaterally.  EXTREMITIES:  No clubbing, cyanosis or  edema are appreciated.  NEUROLOGICAL:  The patient has decreased light touch on the lower  extremities.  She has 5/5 strength in the bed, weightbearing was not  attempted as she has fallen twice today and her position in the  emergency room does not allow this at this time.  MUSCULOSKELETAL:  The patient has no gross joint deformities noted.   STUDIES:  X-rays done of hips and back including pelvic area showed no  evidence of acute fracture.   LABORATORY DATA:  BUN and creatinine are within normal limits.  Her  hemoglobin 11.98.  Urinalysis was done which is negative.  Protime has  not been done.  Thyroid testing has not been done at this time.   ASSESSMENT/PLAN:  1. Generalized weakness, mostly located in her lower extremities.  I      am not certain as to whether this is related to spinal stenosis or      some other cause.  I started her on prednisone 40 mg a day in hopes      that if it is spinal stenosis, this will help some.  She has had      multiple injections in her back, and I feel that the best expert on      this would probably be Dr. Cleophas Dunker.  Consider consultation in the      morning.  Will also get a physical therapy/occupational therapy      consult, continue her on her Neurontin and provide her with Vicodin      as needed.  She will only be up with assistance to avoid any      further falls this evening.  Family is in agreement with this plan.  2. Hypertension currently controlled.  3. Atrial fibrillation.  Coumadin dosing per pharmacy they will dose      based on her protime this evening, it is written as 3 mg currently      which is a regular dose according to her last Coumadin check.  4. Hypothyroidism.  Will check TSH.  5. Gastroesophageal reflux disease.  Placed on Prilosec.  6. DVT prophylaxis.  The patient is currently on Coumadin, so Lovenox      does not apply in this case.   DISPOSITION:  This patient will probably need skilled nursing facility  if she  cannot confidently support her weight.  She would not be even  considered for assisted living at this point.  Gaspar Garbe, M.D.  Electronically Signed     Gaspar Garbe, M.D.  Electronically Signed    RWT/MEDQ  D:  09/30/2006  T:  10/01/2006  Job:  [id number]   cc:   Barry Dienes. Eloise Harman, M.D.

## 2010-05-27 NOTE — Op Note (Signed)
NAME:  [name], [name]                   ACCOUNT NO.:  [id number]   MEDICAL RECORD NO.:  [id number]                   PATIENT TYPE:  AMB   LOCATION:  DSC                                  FACILITY:  [facility]   PHYSICIAN:  Katy Fitch. Naaman Plummer., M.D.          DATE OF BIRTH:  [birth year]   DATE OF PROCEDURE:  09/03/2003  DATE OF DISCHARGE:                                 OPERATIVE REPORT   PREOPERATIVE DIAGNOSES:  1. Chronic entrapment neuropathy, right median nerve at carpal tunnel.  2. 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   POSTOPERATIVE DIAGNOSES:  1. Chronic entrapment neuropathy, right median nerve at carpal tunnel.  2. 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   OPERATION/PROCEDURE:  1. Release of right transverse carpal ligament.  2. Decompression of right ulnar nerve at cubital tunnel.   SURGEON:  Katy Fitch. Sypher, M.D.   ASSISTANT:  Marveen Reeks. Dasnoit, P.A.C.   ANESTHESIA:  General by LMA with prior failed attempt at axillary block.  Supervising anesthesiologist is W. Autumn Patty, M.D.   INDICATIONS:  [name] is an 77 year old right-hand dominant  woman referred by Dr. Tilman Neat and Dr. Dimas Alexandria for evaluation  and management of a numb and painful right upper extremity.  Clinical  examination by Dr. Lendell Caprice had suggested carpal tunnel syndrome.  She was  referred for detailed electrodiagnostic studies at Dr. Chriss Driver office on  April 13, 2003 which documented severe right carpal tunnel syndrome and a  chronic ulnar neuropathy at the level of the elbow.  Due to a failure to  respond to non-operative measures, she is brought to the operating room at  this time anticipating decompression of her ulnar nerve at the cubital  tunnel and right carpal tunnel release.   After informed consent, she was brought to the operating room.  Preoperatively questions were invited and answered with Mrs.  [name] as  well as two of her daughters.   DESCRIPTION OF PROCEDURE:  Jerry Haugen was brought to the operating  room and placed in the supine position on the operating table.  Following  attempted axillary block in the holding area, anesthesia was incomplete in  the right arm.  Therefore, Dr. Sampson Goon recommended proceeding with  general anesthesia by LMA technique.  When anesthesia was induced and  stable, the right arm was prepped with Betadine solution and sterilely  draped.   Following exsanguination of the right arm with an Esmarch bandage, an  arterial tourniquet at the proximal brachium was placed at 220 mmHg.   The procedure commenced with a short incision in line with the ring finger  in the palm.  Subcutaneous tissue was carefully divided with the palmar  fascia.  This was split longitudinally to the common sensory branches of the  median nerve.  These were followed back to the median nerve proper which was  gently separated from the transverse carpal ligament.   [name] was  noted to have a very significant indentation in the  median nerve with considerable rubor and neovascularization consistent with  chronic severe compression. She had a large pseudo neuroma proximal to the  level of the transverse carpal ligament.  The ligament was released  carefully along its ulnar borders with scissors taking care the avoid  contact with the pseudo neuroma.   Bleeding points along the margin of the released ligament were  electrocauterized with bipolar current.  After completion of the release of  the volar forearm fascia, the carpal canal and its contents were widely  decompressed.  There were no other masses or predicaments noted other a very  hypertrophic ulnar bursa.  The pseudo neuroma should resolve over time.  The  wound was then repaired with intradermal 3-0 Prolene suture.   Attention was then directed to the elbow.  A 2 cm incision was fashioned   directly posterior to the medial epicondyle.  Subcutaneous tissue was  carefully divided taking care to identify the ulnar nerve in the distal  brachium.  The arcuate ligament was isolated and split with scissors  followed by release of the fascia at the head of flexor carpi ulnaris.  Several dense fascial bands were noted about 1 cm distal to the epicondyle  causing compression of the nerve deep to the flexor carpi ulnaris.  The  nerve was decompressed approximately 6 cm distal to the epicondyle and  proximal dissection of the brachial fascia released the nerve above the  elbow over a distance of 5 cm.   The nerve was stable to range of motion of 0-140 degrees above full flexion.  Note triceps resection was required.  The wound was then inspected for  bleeding points and subsequently repaired with intradermal 0 Prolene and  Steri-Strips.  Compressive dressing was applied to the elbow with sterile  gauze, Op-Site and an Ace wrap.  A volar plaster splint was applied to the  wrist.   For after care, we have advised [name] to begin immediate range of  motion exercises to her elbow and range of motion exercises to her finger.  She will return to our office for followup in approximately one week for  advancement to a more vigorous exercise program out of her splint.                                               Katy Fitch Naaman Plummer., M.D.    RVS/MEDQ  D:  09/03/2003  T:  09/03/2003  Job:  [id number]   cc:   Janae Bridgeman. Eloise Harman., M.D.  [address]  [place]  [place] [address]  Fax: [phone]   Clabe Seal. Meryl Crutch, M.D.  [address]  [place]  [place] [address]  Fax: [phone]

## 2010-05-27 NOTE — H&P (Signed)
[name], [name]         ACCOUNT NO.:  [id number]   MEDICAL RECORD NO.:  [id number]          PATIENT TYPE:  INP   LOCATION:  0101                         FACILITY[facility]   PHYSICIAN:  Beckey Rutter, MD  DATE OF BIRTH:  12-Oct-1911   DATE OF ADMISSION:  04/27/2006  DATE OF DISCHARGE:                              HISTORY & PHYSICAL   STAT HISTORY AND PHYSICAL   PRIMARY CARE PHYSICIAN:  Patient is following up with a primary  physician at [facility].  She is unassigned to [place].   PRIMARY CARDIOLOGIST:  Colleen Can. Deborah Chalk, MD.   CHIEF COMPLAINT:  Dizzy/unsteady gait.   HISTORY OF PRESENT ILLNESS:  This is a 77 year old, pleasant, Caucasian  female with a past medical history significant for atrial fibrillation,  GERD, hiatal hernia, hypertension, spinal stenosis, and recent history  of bronchitis.  She came today from assisted living after she became  dizzy this morning and she had difficulty talking.  She also felt that  she was unsteady and she could not move her legs because of the  generalized fatigue and the state of dizziness that she has experienced.  She was also that she was unaware of her surroundings when people were  talking to her.  Now she is giving a history, but she could not  recollect exactly all the details about this episode.  But she admitted  to having not been herself for the last three days, but never like what  she is describing now with extreme dizziness.  She also gave history of  recent cough that she was told that her diagnosis is bronchitis and she  received Levaquin for about four to five days.   PAST MEDICAL HISTORY:  1. Atrial fibrillation following up with her primary cardiologist, Dr.      Deborah Chalk.  2. History of GERD.  3. Hiatal hernia.  4. Hypertension.  5. Spinal stenosis.  6. Hypothyroidism.  7. Recently bronchitis.   FAMILY HISTORY:  Unremarkable.   SOCIAL HISTORY:  She lives in assisted living.  No drug abuse, tobacco  abuse, or ethanol abuse.   DRUG ALLERGIES:  She is not known to have drug allergies.   MEDICATIONS:  1. Calcium carbonate.  2. Boniva 150 mg p.o. once a month.  3. Synthroid 75 mcg daily.  4. Nexium 40 mg.  5. Tiazac-CD 240 once a day.  6. Hydrochlorothiazide 25 mg once a day.  7. Lisinopril 20 mg daily.  8. Lasix 40 mg daily.  9. Coumadin, dose is 2.5 mg.  10.Vitamin C one tab daily.  11.Colace 100 mg h.s.  12.Neurontin 100 mg three times a day.  13.Multivitamins one tab per day.   REVIEW OF SYSTEMS:  As per HPI.  Categorical questioning for headache  and fever is negative.   PHYSICAL EXAMINATION:  VITAL SIGNS:  Temperature 97.5, pulse 61,  respiratory rate 18, blood pressure 99/50, and the first blood pressure  recorded is 81/52.  GENERAL EXAM:  She is lying flat in bed not in distress.  HEAD EXAM:  Atraumatic, normocephalic.  MOUTH:  Moist, no ulcer.  EYES EXAM:  PERRL.  NECK:  Supple, no JVD.  ABDOMEN:  Distended, soft, no tenderness, bowel sounds present.  GU:  No CVA tenderness.  EXTREMITIES:  No edema.  SKIN:  No hyperpigmentation.  NEUROLOGIC EXAMINATION:  Patient alert, oriented x3, giving a history,  and she is moving all her extremities spontaneously.  No focal deficits.  Sensation intact.   LABS AND X-RAYS:  White count 10.8, hemoglobin 12.8, hematocrit 37.6,  sodium 133, potassium 5.7, chloride 100, bicarb 28, BUN 48, creatinine  1.8, glucose 104.  Urinalysis is essentially negative.  INR 3.4.  CT  chest is essentially negative for acute finding.  EKG pending.  Will  document result on paper chart.   IMPRESSION:  This is an 77 year old female with multiple medical  problems, who came with episode of unsteadiness and dizziness, which at  least when she came to the emergency room, the blood pressure was low.  The patient was taking three antihypertensive medications because of  hypertension and to control the heart rate, but it seems like the blood   pressure is lower than what you would except with systolic blood  pressure 81, which could explain the dizziness and generalized fatigues  and unsteadiness.  That said, we still might need to rule out transient  ischemic attacks or any cerebrovascular accidents that are not detected  by computerized tomography.   PLAN:  1. The patient will be admitted for further assessment and management.  2. Patient will be admitted to telemetry floor.  3. We will obtain MRI for further assessment of this episode.  4. We will order a carotid duplex.  5. We will check a cholesterol level.  6. We will check orthostatic blood pressure today and daily.  7. For her A-fib, patient will be continued on Coumadin pharmacy      dosing.  We will continue the Cardizem, but instead of the      continuous release 240, we will break it to four times a day.  We      will hold the Lisinopril for now as well as the      hydrochlorothiazide.  8. CHF:  I am not sure if she was diagnosed with congestive heart      failure at this time, but she was taking Lasix as an outpatient.      We will continue Lasix one dose pending the two-D echo especially      that patient has elevated creatinine.  We will obtain the two-D      echo for further assessment of left ventricular ejection fraction      as well as wall motion abnormalities.  9. Renal failure.  Patient has elevated creatinine 1.8.  I am not sure      of her baseline creatinine, but for now we will continue to      monitor.  We will refrain from giving nephrotoxic medication at      this time, and we will consider further assessment with sonogram as      needed.  10.For hypothyroidism, we will continue Synthroid 75 mcg, and we will      check a TSH.  11.For her GERD, we will continue the patient on PPI.  She was taking      Nexium 40 once a day.  We will give her Protonix 40 mg twice a day     during this hospitalization.  12.For DVT prophylaxis, the patient is  already on Coumadin with a  therapeutic range so we will not add any antihypertensive at this      time.  13.We will consider physical therapy evaluation to help with her      movement and further assessment of her gait.  14.We will order for chest x-ray for completion of admission work and      evaluate for her bronchitis that given in the history.      Beckey Rutter, MD  Electronically Signed     EME/MEDQ  D:  04/27/2006  T:  04/27/2006  Job:  [id number]

## 2010-05-27 NOTE — Discharge Summary (Signed)
[name], [name]         ACCOUNT NO.:  [id number]   MEDICAL RECORD NO.:  [id number]          PATIENT TYPE:  INP   LOCATION:  5004                         FACILITY:  [facility]   PHYSICIAN:  Barry Dienes. Eloise Harman, M.D.DATE OF BIRTH:  08/20/1911   DATE OF ADMISSION:  09/30/2006  DATE OF DISCHARGE:  10/03/2006                               DISCHARGE SUMMARY   PERTINENT FINDINGS:  The patient is a 77 year old white female with  several medical problems who presented to the emergency room after  several falls associated with bilateral leg weakness.  She was  transported from the [facility] section.  She has a  history of degenerative joint disease and degenerative disk disease of  the lumbar spine.  She also has a history of bilateral peripheral  neuropathy.  Initial laboratory studies and x-rays did not show an acute  issue, however, since she could not ambulate, she was admitted for  further evaluation.   PAST MEDICAL HISTORY:  1. Hypothyroidism.  2. Atrial fibrillation on Coumadin.  3. Gastroesophageal reflux disease.  4. Chronic low back pain with spinal stenosis.  5. Osteoporosis.  6. Osteoarthritis.  7. Hemorrhoids for which she had colonoscopy within the past year.   PHYSICAL EXAMINATION:  VITAL SIGNS:  Temperature 97.9, pulse 85,  respirations 18, blood pressure 114/56, pulse oxygen saturation 97% on  room air.  GENERAL:  She is an elderly white female who is in no apparent distress  while lying in a gurney.  HEENT:  Exam was within normal limits.  NECK:  Supple without jugular venous distention or carotid bruit.  HEART:  Had an irregularly irregular rhythm consistent with atrial  fibrillation.  ABDOMEN:  Benign.  CHEST:  Clear to auscultation.  EXTREMITIES:  Without cyanosis, clubbing, or edema.  NEUROLOGICAL:  She was alert and well-oriented.  She had decreased light  touch sensation in both legs and could move all extremities reasonably   well.   STUDIES IN THE EMERGENCY ROOM:  X-rays of both hip and lumbar spine and  pelvis showed no evidence of an acute fracture.  Her renal function was  normal.  Her hemoglobin was 11.98.  Urinalysis was unremarkable   HOSPITAL COURSE:  The patient was admitted to a medical bed without  telemetry.  She had orthostatic vital signs done that showed that she  was not tilt positive.  She had a CT scan of the head done that showed  no acute abnormalities.  Further workup of her anemia included labs  showing ferritin 192, serum iron 20, total iron binding capacity 203  (10% saturation), B12 1742, folate 13.1 and TSH 1.0, with hemoglobin 10,  hematocrit 30.  She had an MRI scan of the lumbar spine done that showed  chronic compression fractures at the T12 and L1 level with no acute  fractures.  At the L2 right inferior endplate region, there was edema  felt secondary to a Schmorl's node.  She also had scoliosis with  multilevel osteoarthritis changes and mild central canal stenosis at the  L 03/04 level.  There were no areas of severe spinal stenosis or acute  herniated  disks.  She was started on a short course of prednisone.  With  that, she was able to ambulate with a walker with only standby  assistance.   PROCEDURES:  MRI of the lumbar spine.   COMPLICATIONS:  None.   CONDITION ON DISCHARGE:  She was able to sleep well.  She had no  shortness of breath or leg pain.  Her most recent physical exam included  vital signs with blood pressure 114/55, pulse 56, respirations 20,  temperature 98, pulse oxygen saturation 97% on room air.  Most recent  laboratory studies were notable for an INR of 2.7 with reticulocytes  1.2%.  Her chest was clear to auscultation.  Heart had a regular rate  and rhythm.  Her abdomen was benign.  Extremities had bilateral 1+ ankle  edema.  With gait assessment, she was able to change from a sitting  position to standing position at a walker and walk in the room  with the  turnaround with only standby assistance.  She had only mild low back  pain at that time.   DISCHARGE DIAGNOSES:  1. Gait instability with an inability to walk that was transient.  2. Lumbar spine osteoarthritis and degenerative disk disease with      spinal stenosis.  3. Osteoporosis with old compression fractures at the T12 and L1      levels  4. L2 end-plate edema due to Schmorl's node.  5. Hypothyroidism.  6. Gastroesophageal reflux disease.  7. Chronic anemia, iron deficient.  8. Hypertension.  9. Idiopathic peripheral neuropathy.  10.Chronic obstructive pulmonary disease.  11.Chronic atrial fibrillation.  12.Chronic anticoagulation.   DISCHARGE MEDICATIONS:  1. Synthroid 75 mcg p.o. daily.  2. Prilosec 20 mg p.o. twice daily.  3. Hydrochlorothiazide 12.5 mg p.o. daily.  4. Lasix 40 mg p.o. daily.  5. Cardizem CD 240 mg p.o. daily.  6. Neurontin 200 mg p.o. t.i.d.  7. Lisinopril 10 mg p.o. daily.  8. Xopenex HFA 2 puffs p.o. q.i.d.  9. Coumadin 3 mg p.o. daily.  10.Caltrate D 600 twice daily.  11.Nu-Iron 150 take one tablet daily.  12.Prednisone 20 mg daily for 7 days.  13.Vicodin 5/500 1-2 tablets p.o. t.i.d. p.r.n. pain.   DISPOSITION/FOLLOWUP:  She will be seen by home health nurse and  physical therapy upon discharge.  She was advised to schedule a follow-  up appoint with Dr. Jarome Matin at [facility] in  approximately one week following discharge.  She was advised to use her  walker at all times.           ______________________________  Barry Dienes Eloise Harman, M.D.     DGP/MEDQ  D:  11/07/2006  T:  11/07/2006  Job:  [id number]

## 2010-05-27 NOTE — Discharge Summary (Signed)
NAMEMELBA, ARAKI         ACCOUNT NO.:  [id number]   MEDICAL RECORD NO.:  [id number]          PATIENT TYPE:  INP   LOCATION:  1423                         FACILITY:  [facility]   PHYSICIAN:  Beckey Rutter, MD  DATE OF BIRTH:  May 20, 1911   DATE OF ADMISSION:  04/27/2006  DATE OF DISCHARGE:                               DISCHARGE SUMMARY   PRIMARY CARE PHYSICIAN:  Unassigned to [facility].   PRIMARY CARDIOLOGIST:  Colleen Can. Deborah Chalk, M.D.   CHIEF COMPLAINT:  On admission dizzy and unsteady gait.   HISTORY OF PRESENT ILLNESS:  Briefly, 77 year old Caucasian came for a  spell of dizziness.   HOSPITAL COURSE:  During hospitalization,  the patient was kept on  telemonitoring with no activity.  She had a CT scan done on admission  date as well as  MRI/MRA which are negative for stroke.  The patient's  symptoms was sort of transient attack,  but the patient was therapeutic  on Coumadin for her atrial fibrillation.  At this time we will consider  secondary prevention of stroke with lowering blood pressure,  lowering  blood cholesterol as well as to rate control and anticoagulation for her  atrial fibrillation. The patient is stable for discharge today to follow  up with her primary physician for further management.   DISCHARGE DIAGNOSES:  1. Spells of dizziness and unsteady gait.  Could be secondary to      transient ischemic attack or simple hypotension secondary to the      antihypertensive medication she is getting.  2. History of atrial fibrillation.  3. History of gastroesophageal reflux disease.  4. History of hiatal hernia.  5. History of hypertension.  6. History of spinal stenosis.  7. Hypothyroidism.  8. Recent history of bronchitis.   DISCHARGE MEDICATIONS:  1. Calcium carbonate.  2. Boniva 150 mg p.o. once a month.  3. Synthroid 75 mg daily.  4. Nexium 40 mg p.o. daily.  5. Cardizem CD 240 mg once a day.  6. Hydrochlorothiazide 25 mg p.o. daily  7.  Lisinopril  20 mg p.o. daily.  8. Lasix 20 mg p.o. daily.  9. Coumadin 2.5 mg p.o. daily, dose adjusted per INR value.  10.Vitamin C one tabs p.o. daily.  11.Neurontin 100 mg q.8h.  12.Multivitamins one time p.o. daily.   HOSPITAL PROCEDURES:  A CT scan done on the day of admission with  impression reading:  Findings compatible with extensive chronic small  vessel disease involving the subcortical and deep white matter.  Internal and external capsule and right thalamus.  Findings would be  compatible with chronic hypertensive encephalopathy.  Frontal and by  maxillary sinusitis.  No acute intracranial abnormalities.   The patient had MRI of the brain with impression reading:  1) No acute  intracranial abnormality chronic small vessel disease. 2) Maxillary  sinus air fluid level.  3) Mild anterior ethmoid and left frontal  mucosal thickening.  In the appropriate clinical setting, findings are  compatible with acute sinusitis.   The patient had also MRA on April 19 with the impression reading:  Evidence of atherosclerosis involvement and stenosis of the distal,  vertebral and proximal basilar artery with preserved distal basilar  flow.  2) Otherwise unremarkable study aside anatomic variance.   The patient had chest x-ray done on April 18 with the reading:  1)  Cardiomegaly.  2) COPD.  3) Large hiatus hernia.   She also had hip x-ray done on April 18 with reading of chronic findings  as described above.  No acute left hip abnormalities.   DISCHARGE/PLAN:  The patient will have carotid duplex done today before  discharge.  The results will be documented on the chart,  but clinically  does not seem she has carotid stenosis.  The patient will be released to  continue on her medication with secondary prevention of stroke with good  blood pressure control with lowering her cholesterol.  Note cholesterol  numbers done on this admission reading total cholesterol 166,  triglycerides 237,  HDL 30, LDL 89.  The patient is not taking any blood  lowering medication at this time.  The patient is going to be discharged  without blood lowering medication, though the triglycerides is slightly  elevated.  The patient was advised to refrain from eating a lot of sweet  food and to follow up with her primary regarding this issue.  The  patient is stable for discharge aware and agreeable of the discharge  planning.      Beckey Rutter, MD  Electronically Signed     EME/MEDQ  D:  04/30/2006  T:  04/30/2006  Job:  [id number]

## 2010-06-10 ENCOUNTER — Emergency Department (HOSPITAL_COMMUNITY): Payer: Medicare Other

## 2010-06-10 ENCOUNTER — Emergency Department (HOSPITAL_COMMUNITY)
Admission: EM | Admit: 2010-06-10 | Discharge: 2010-06-10 | Disposition: A | Discharge status: Home / Self Care | Payer: Medicare Other | Attending: Emergency Medicine | Admitting: Emergency Medicine

## 2010-06-10 DIAGNOSIS — M899 Disorder of bone, unspecified: Secondary | ICD-10-CM | POA: Insufficient documentation

## 2010-06-10 DIAGNOSIS — M549 Dorsalgia, unspecified: Secondary | ICD-10-CM | POA: Insufficient documentation

## 2010-06-10 DIAGNOSIS — M25559 Pain in unspecified hip: Secondary | ICD-10-CM | POA: Insufficient documentation

## 2010-06-10 DIAGNOSIS — E039 Hypothyroidism, unspecified: Secondary | ICD-10-CM | POA: Insufficient documentation

## 2010-06-10 DIAGNOSIS — IMO0002 Reserved for concepts with insufficient information to code with codable children: Secondary | ICD-10-CM | POA: Insufficient documentation

## 2010-06-10 DIAGNOSIS — T1490XA Injury, unspecified, initial encounter: Secondary | ICD-10-CM | POA: Insufficient documentation

## 2010-06-10 DIAGNOSIS — Y9210 Unspecified residential institution as the place of occurrence of the external cause: Secondary | ICD-10-CM | POA: Insufficient documentation

## 2010-06-10 DIAGNOSIS — W19XXXA Unspecified fall, initial encounter: Secondary | ICD-10-CM | POA: Insufficient documentation

## 2010-06-10 DIAGNOSIS — I4891 Unspecified atrial fibrillation: Secondary | ICD-10-CM | POA: Insufficient documentation

## 2010-06-10 DIAGNOSIS — G8929 Other chronic pain: Secondary | ICD-10-CM | POA: Insufficient documentation

## 2010-06-10 DIAGNOSIS — I10 Essential (primary) hypertension: Secondary | ICD-10-CM | POA: Insufficient documentation

## 2010-06-17 ENCOUNTER — Emergency Department (HOSPITAL_COMMUNITY): Payer: Medicare Other

## 2010-06-17 ENCOUNTER — Emergency Department (HOSPITAL_COMMUNITY)
Admission: EM | Admit: 2010-06-17 | Discharge: 2010-06-17 | Disposition: A | Discharge status: Home / Self Care | Payer: Medicare Other | Attending: Emergency Medicine | Admitting: Emergency Medicine

## 2010-06-17 DIAGNOSIS — J4489 Other specified chronic obstructive pulmonary disease: Secondary | ICD-10-CM | POA: Insufficient documentation

## 2010-06-17 DIAGNOSIS — L989 Disorder of the skin and subcutaneous tissue, unspecified: Secondary | ICD-10-CM | POA: Insufficient documentation

## 2010-06-17 DIAGNOSIS — J449 Chronic obstructive pulmonary disease, unspecified: Secondary | ICD-10-CM | POA: Insufficient documentation

## 2010-06-17 DIAGNOSIS — R5381 Other malaise: Secondary | ICD-10-CM | POA: Insufficient documentation

## 2010-06-17 DIAGNOSIS — Z9889 Other specified postprocedural states: Secondary | ICD-10-CM | POA: Insufficient documentation

## 2010-06-17 DIAGNOSIS — I10 Essential (primary) hypertension: Secondary | ICD-10-CM | POA: Insufficient documentation

## 2010-06-17 DIAGNOSIS — N390 Urinary tract infection, site not specified: Secondary | ICD-10-CM | POA: Insufficient documentation

## 2010-06-17 DIAGNOSIS — R21 Rash and other nonspecific skin eruption: Secondary | ICD-10-CM | POA: Insufficient documentation

## 2010-06-17 DIAGNOSIS — Z043 Encounter for examination and observation following other accident: Secondary | ICD-10-CM | POA: Insufficient documentation

## 2010-06-17 DIAGNOSIS — R609 Edema, unspecified: Secondary | ICD-10-CM | POA: Insufficient documentation

## 2010-06-17 DIAGNOSIS — I4891 Unspecified atrial fibrillation: Secondary | ICD-10-CM | POA: Insufficient documentation

## 2010-06-17 DIAGNOSIS — M542 Cervicalgia: Secondary | ICD-10-CM | POA: Insufficient documentation

## 2010-06-17 DIAGNOSIS — K219 Gastro-esophageal reflux disease without esophagitis: Secondary | ICD-10-CM | POA: Insufficient documentation

## 2010-06-17 DIAGNOSIS — R197 Diarrhea, unspecified: Secondary | ICD-10-CM | POA: Insufficient documentation

## 2010-06-17 DIAGNOSIS — R079 Chest pain, unspecified: Secondary | ICD-10-CM | POA: Insufficient documentation

## 2010-06-17 DIAGNOSIS — Z79899 Other long term (current) drug therapy: Secondary | ICD-10-CM | POA: Insufficient documentation

## 2010-06-17 DIAGNOSIS — I959 Hypotension, unspecified: Secondary | ICD-10-CM | POA: Insufficient documentation

## 2010-06-17 DIAGNOSIS — W1811XA Fall from or off toilet without subsequent striking against object, initial encounter: Secondary | ICD-10-CM | POA: Insufficient documentation

## 2010-06-17 DIAGNOSIS — E039 Hypothyroidism, unspecified: Secondary | ICD-10-CM | POA: Insufficient documentation

## 2010-06-17 DIAGNOSIS — G8929 Other chronic pain: Secondary | ICD-10-CM | POA: Insufficient documentation

## 2010-06-17 LAB — URINALYSIS, ROUTINE W REFLEX MICROSCOPIC
Specific Gravity, Urine: 1.004 — ABNORMAL LOW (ref 1.005–1.030)
Urobilinogen, UA: 1 mg/dL (ref 0.0–1.0)

## 2010-06-17 LAB — DIFFERENTIAL
Lymphocytes Relative: 15 % (ref 12–46)
Lymphs Abs: 1 10*3/uL (ref 0.7–4.0)
Monocytes Absolute: 0.4 10*3/uL (ref 0.1–1.0)
Monocytes Relative: 6 % (ref 3–12)
Neutro Abs: 4.9 10*3/uL (ref 1.7–7.7)

## 2010-06-17 LAB — CBC
HCT: 34.7 % — ABNORMAL LOW (ref 36.0–46.0)
Hemoglobin: 11.8 g/dL — ABNORMAL LOW (ref 12.0–15.0)
MCH: 32.2 pg (ref 26.0–34.0)
MCHC: 34 g/dL (ref 30.0–36.0)
MCV: 94.8 fL (ref 78.0–100.0)

## 2010-06-17 LAB — BASIC METABOLIC PANEL
CO2: 25 mEq/L (ref 19–32)
Chloride: 101 mEq/L (ref 96–112)
GFR calc non Af Amer: >60 mL/min (ref >60)
Glucose, Bld: 100 mg/dL — ABNORMAL HIGH (ref 70–99)
Potassium: 4.8 mEq/L (ref 3.5–5.1)
Sodium: 134 mEq/L — ABNORMAL LOW (ref 135–145)

## 2010-06-17 LAB — URINE MICROSCOPIC-ADD ON

## 2010-06-17 LAB — CK TOTAL AND CKMB (NOT AT ARMC): CK, MB: 5.7 ng/mL — ABNORMAL HIGH (ref 0.3–4.0)

## 2010-06-19 LAB — URINE CULTURE: Culture  Setup Time: 201206090159

## 2010-07-21 ENCOUNTER — Encounter: Payer: Self-pay | Admitting: Cardiology

## 2010-07-26 ENCOUNTER — Encounter: Payer: Self-pay | Admitting: Nurse Practitioner

## 2010-07-27 ENCOUNTER — Ambulatory Visit: Payer: Medicare Other | Admitting: Cardiovascular Disease

## 2010-07-28 ENCOUNTER — Telehealth: Payer: Self-pay | Admitting: *Deleted

## 2010-07-28 NOTE — Telephone Encounter (Signed)
Missed app. letter sent

## 2010-10-20 LAB — BASIC METABOLIC PANEL
BUN: 15
Chloride: 102
GFR calc Af Amer: >60
GFR calc non Af Amer: 51 — ABNORMAL LOW
Potassium: 3.7
Sodium: 140

## 2010-10-20 LAB — I-STAT 8, (EC8 V) (CONVERTED LAB)
BUN: 23
Chloride: 100
Glucose, Bld: 104 — ABNORMAL HIGH
HCT: 35 — ABNORMAL LOW
Potassium: 3.9
pCO2, Ven: 43.4 — ABNORMAL LOW
pH, Ven: 7.376 — ABNORMAL HIGH

## 2010-10-20 LAB — CBC
HCT: 30.9 — ABNORMAL LOW
MCV: 96.2
Platelets: 184
RBC: 3.22 — ABNORMAL LOW
WBC: 7.1

## 2010-10-20 LAB — RETICULOCYTES
RBC.: 3.22 — ABNORMAL LOW
Retic Count, Absolute: 38.6

## 2010-10-20 LAB — URINALYSIS, ROUTINE W REFLEX MICROSCOPIC
Bilirubin Urine: NEGATIVE
Hgb urine dipstick: NEGATIVE
Ketones, ur: NEGATIVE
Nitrite: NEGATIVE
Protein, ur: NEGATIVE
Specific Gravity, Urine: 1.021
Urobilinogen, UA: 1

## 2010-10-20 LAB — PROTIME-INR
INR: 2.7 — ABNORMAL HIGH
INR: 3.1 — ABNORMAL HIGH
Prothrombin Time: 29.2 — ABNORMAL HIGH
Prothrombin Time: 29.4 — ABNORMAL HIGH
Prothrombin Time: 32.7 — ABNORMAL HIGH

## 2010-10-20 LAB — IRON AND TIBC
Iron: 20 — ABNORMAL LOW
Saturation Ratios: 10 — ABNORMAL LOW

## 2010-10-20 LAB — POCT I-STAT CREATININE: Creatinine, Ser: 1.3 — ABNORMAL HIGH

## 2010-10-20 LAB — TSH: TSH: 1.012

## 2010-10-20 LAB — VITAMIN B12: Vitamin B-12: 1742 — ABNORMAL HIGH (ref 211–911)

## 2010-12-23 ENCOUNTER — Other Ambulatory Visit: Payer: Self-pay

## 2010-12-23 ENCOUNTER — Inpatient Hospital Stay (HOSPITAL_COMMUNITY)
Admission: AD | Admit: 2010-12-23 | Discharge: 2010-12-30 | DRG: 291 | Disposition: A | Discharge status: Skilled Nursing Facility | Payer: Medicare Other | Source: Ambulatory Visit | Attending: Internal Medicine | Admitting: Internal Medicine

## 2010-12-23 DIAGNOSIS — R269 Unspecified abnormalities of gait and mobility: Secondary | ICD-10-CM | POA: Diagnosis present

## 2010-12-23 DIAGNOSIS — E039 Hypothyroidism, unspecified: Secondary | ICD-10-CM | POA: Insufficient documentation

## 2010-12-23 DIAGNOSIS — I4891 Unspecified atrial fibrillation: Secondary | ICD-10-CM | POA: Diagnosis present

## 2010-12-23 DIAGNOSIS — G8929 Other chronic pain: Secondary | ICD-10-CM

## 2010-12-23 DIAGNOSIS — R5381 Other malaise: Secondary | ICD-10-CM | POA: Diagnosis present

## 2010-12-23 DIAGNOSIS — K219 Gastro-esophageal reflux disease without esophagitis: Secondary | ICD-10-CM | POA: Diagnosis present

## 2010-12-23 DIAGNOSIS — M549 Dorsalgia, unspecified: Secondary | ICD-10-CM | POA: Insufficient documentation

## 2010-12-23 DIAGNOSIS — I639 Cerebral infarction, unspecified: Secondary | ICD-10-CM | POA: Diagnosis present

## 2010-12-23 DIAGNOSIS — I509 Heart failure, unspecified: Principal | ICD-10-CM | POA: Diagnosis present

## 2010-12-23 DIAGNOSIS — M1710 Unilateral primary osteoarthritis, unspecified knee: Secondary | ICD-10-CM | POA: Diagnosis present

## 2010-12-23 DIAGNOSIS — T502X5A Adverse effect of carbonic-anhydrase inhibitors, benzothiadiazides and other diuretics, initial encounter: Secondary | ICD-10-CM | POA: Diagnosis not present

## 2010-12-23 DIAGNOSIS — E876 Hypokalemia: Secondary | ICD-10-CM | POA: Diagnosis not present

## 2010-12-23 DIAGNOSIS — M47817 Spondylosis without myelopathy or radiculopathy, lumbosacral region: Secondary | ICD-10-CM | POA: Diagnosis present

## 2010-12-23 DIAGNOSIS — Z7901 Long term (current) use of anticoagulants: Secondary | ICD-10-CM

## 2010-12-23 DIAGNOSIS — R0600 Dyspnea, unspecified: Secondary | ICD-10-CM | POA: Diagnosis present

## 2010-12-23 DIAGNOSIS — Z8673 Personal history of transient ischemic attack (TIA), and cerebral infarction without residual deficits: Secondary | ICD-10-CM

## 2010-12-23 DIAGNOSIS — E46 Unspecified protein-calorie malnutrition: Secondary | ICD-10-CM | POA: Diagnosis present

## 2010-12-23 DIAGNOSIS — J189 Pneumonia, unspecified organism: Secondary | ICD-10-CM | POA: Diagnosis present

## 2010-12-23 DIAGNOSIS — R5383 Other fatigue: Secondary | ICD-10-CM | POA: Diagnosis present

## 2010-12-23 DIAGNOSIS — D649 Anemia, unspecified: Secondary | ICD-10-CM | POA: Diagnosis present

## 2010-12-23 DIAGNOSIS — Z66 Do not resuscitate: Secondary | ICD-10-CM | POA: Diagnosis present

## 2010-12-23 DIAGNOSIS — M810 Age-related osteoporosis without current pathological fracture: Secondary | ICD-10-CM | POA: Insufficient documentation

## 2010-12-23 DIAGNOSIS — M25569 Pain in unspecified knee: Secondary | ICD-10-CM | POA: Insufficient documentation

## 2010-12-23 DIAGNOSIS — R2681 Unsteadiness on feet: Secondary | ICD-10-CM | POA: Diagnosis present

## 2010-12-23 LAB — CBC
MCH: 30.7 pg (ref 26.0–34.0)
MCHC: 31.9 g/dL (ref 30.0–36.0)
MCV: 96.3 fL (ref 78.0–100.0)
Platelets: 221 10*3/uL (ref 150–400)
RBC: 2.41 MIL/uL — ABNORMAL LOW (ref 3.87–5.11)

## 2010-12-23 LAB — COMPREHENSIVE METABOLIC PANEL
ALT: 10 U/L (ref 0–35)
Alkaline Phosphatase: 59 U/L (ref 39–117)
BUN: 19 mg/dL (ref 6–23)
CO2: 26 mEq/L (ref 19–32)
Chloride: 103 mEq/L (ref 96–112)
GFR calc Af Amer: 67 mL/min — ABNORMAL LOW (ref >90)
GFR calc non Af Amer: 58 mL/min — ABNORMAL LOW (ref >90)
Glucose, Bld: 129 mg/dL — ABNORMAL HIGH (ref 70–99)
Potassium: 3.7 mEq/L (ref 3.5–5.1)
Sodium: 138 mEq/L (ref 135–145)
Total Bilirubin: 0.8 mg/dL (ref 0.3–1.2)

## 2010-12-23 LAB — PRO B NATRIURETIC PEPTIDE: Pro B Natriuretic peptide (BNP): 4898 pg/mL — ABNORMAL HIGH (ref 0–450)

## 2010-12-23 LAB — PROTIME-INR: Prothrombin Time: 35.2 seconds — ABNORMAL HIGH (ref 11.6–15.2)

## 2010-12-23 MED ORDER — DEXTROSE 5 % IV SOLN
1.0000 g | INTRAVENOUS | Status: AC
  Administered 2010-12-23 – 2010-12-29 (×7): 1 g via INTRAVENOUS
  Filled 2010-12-23 (×8): qty 10

## 2010-12-23 MED ORDER — PANTOPRAZOLE SODIUM 40 MG PO TBEC
40.0000 mg | DELAYED_RELEASE_TABLET | ORAL | Status: DC
Start: 2010-12-24 — End: 2010-12-24
  Administered 2010-12-24: 40 mg via ORAL
  Filled 2010-12-23: qty 1

## 2010-12-23 MED ORDER — CALCIUM CARBONATE-VITAMIN D 500-200 MG-UNIT PO TABS
1.0000 | ORAL_TABLET | Freq: Two times a day (BID) | ORAL | Status: DC
  Administered 2010-12-23 – 2010-12-30 (×14): 1 via ORAL
  Filled 2010-12-23 (×16): qty 1

## 2010-12-23 MED ORDER — SODIUM CHLORIDE 0.9 % IV SOLN: 250.0000 mL | INTRAVENOUS | Status: DC | PRN

## 2010-12-23 MED ORDER — GABAPENTIN 300 MG PO CAPS
300.0000 mg | ORAL_CAPSULE | Freq: Three times a day (TID) | ORAL | Status: DC
  Administered 2010-12-23: 600 mg via ORAL
  Administered 2010-12-24: 300 mg via ORAL
  Administered 2010-12-24 (×2): 600 mg via ORAL
  Administered 2010-12-25 (×2): 300 mg via ORAL
  Administered 2010-12-25: 600 mg via ORAL
  Administered 2010-12-26 – 2010-12-29 (×11): 300 mg via ORAL
  Administered 2010-12-29: 600 mg via ORAL
  Administered 2010-12-30 (×2): 300 mg via ORAL
  Filled 2010-12-23 (×22): qty 2

## 2010-12-23 MED ORDER — TIOTROPIUM BROMIDE MONOHYDRATE 18 MCG IN CAPS
18.0000 ug | ORAL_CAPSULE | Freq: Every day | RESPIRATORY_TRACT | Status: DC
  Administered 2010-12-25 – 2010-12-30 (×5): 18 ug via RESPIRATORY_TRACT
  Filled 2010-12-23 (×3): qty 5

## 2010-12-23 MED ORDER — WARFARIN SODIUM 4 MG PO TABS: 4.0000 mg | ORAL_TABLET | ORAL | Status: DC

## 2010-12-23 MED ORDER — NITROGLYCERIN 0.4 MG SL SUBL: 0.4000 mg | SUBLINGUAL_TABLET | SUBLINGUAL | Status: DC | PRN

## 2010-12-23 MED ORDER — HYDROCODONE-ACETAMINOPHEN 5-325 MG PO TABS: 1.0000 | ORAL_TABLET | Freq: Three times a day (TID) | ORAL | Status: DC | PRN

## 2010-12-23 MED ORDER — SIMVASTATIN 5 MG PO TABS
5.0000 mg | ORAL_TABLET | Freq: Every day | ORAL | Status: DC
  Administered 2010-12-23 – 2010-12-29 (×8): 5 mg via ORAL
  Filled 2010-12-23 (×8): qty 1

## 2010-12-23 MED ORDER — WARFARIN SODIUM 2 MG PO TABS: 2.0000 mg | ORAL_TABLET | ORAL | Status: DC

## 2010-12-23 MED ORDER — POLYETHYLENE GLYCOL 3350 17 G PO PACK
17.0000 g | PACK | Freq: Every day | ORAL | Status: DC | PRN
  Filled 2010-12-23: qty 1

## 2010-12-23 MED ORDER — POLYSACCHARIDE IRON 150 MG PO CAPS
150.0000 mg | ORAL_CAPSULE | ORAL | Status: DC
  Administered 2010-12-23 – 2010-12-29 (×4): 150 mg via ORAL
  Filled 2010-12-23 (×4): qty 1

## 2010-12-23 MED ORDER — DEXTROSE 5 % IV SOLN
500.0000 mg | INTRAVENOUS | Status: DC
  Administered 2010-12-23 – 2010-12-26 (×4): 500 mg via INTRAVENOUS
  Filled 2010-12-23 (×6): qty 500

## 2010-12-23 MED ORDER — LEVOTHYROXINE SODIUM 75 MCG PO TABS
75.0000 ug | ORAL_TABLET | Freq: Every day | ORAL | Status: DC
  Administered 2010-12-23 – 2010-12-30 (×8): 75 ug via ORAL
  Filled 2010-12-23 (×8): qty 1

## 2010-12-23 MED ORDER — ENSURE IMMUNE HEALTH PO LIQD
237.0000 mL | ORAL | Status: DC
  Administered 2010-12-24: 237 mL via ORAL
  Administered 2010-12-25: 06:00:00 via ORAL
  Administered 2010-12-26 – 2010-12-30 (×5): 237 mL via ORAL

## 2010-12-23 MED ORDER — SODIUM CHLORIDE 0.9 % IJ SOLN: 3.0000 mL | INTRAMUSCULAR | Status: DC | PRN

## 2010-12-23 MED ORDER — ALPRAZOLAM 0.25 MG PO TABS: 0.1250 mg | ORAL_TABLET | Freq: Four times a day (QID) | ORAL | Status: DC | PRN

## 2010-12-23 MED ORDER — OMEGA-3 FATTY ACIDS 1000 MG PO CAPS
1.0000 g | ORAL_CAPSULE | Freq: Every evening | ORAL | Status: DC
  Administered 2010-12-23 – 2010-12-29 (×7): 1 g via ORAL
  Filled 2010-12-23 (×8): qty 1

## 2010-12-23 MED ORDER — SODIUM CHLORIDE 0.9 % IJ SOLN
3.0000 mL | Freq: Two times a day (BID) | INTRAMUSCULAR | Status: DC
  Administered 2010-12-23 – 2010-12-30 (×14): 3 mL via INTRAVENOUS

## 2010-12-23 NOTE — H&P (Signed)
[name] is an 77 y.o. female.   Chief Complaint: Difficulty breathing and weakness HPI:  The patient is a 77 year old woman with several medical problems, most notably atrial fibrillation for which she is on chronic anti-coagulation, early 2012 embolic stroke causing right upper extremity weakness and expressive aphasia, and advanced osteoarthritis of the lumbar spine and bilateral knees who over the past 2-3 days has had increasing symptoms of dry cough, wheezing, shortness of breath, and pervasive weakness.  She has not had fever, chills, or substernal chest pain. She lives by herself and has one hour of caretaker assistance 3 days per week along with care by her daughter on the weekends.  There has been some question as to whether or not she is taking her medicines appropriately.After her stroke in January 2012 she was transferred to a skilled nursing facility for rehabilitation.  Her family realizes that she could ideally be supervised in an assisted living setting, however she does not have enough financial resources to pay for this, and has too much to qualify for Medicaid.  Past Medical History  Diagnosis Date  . Chronic atrial fibrillation   . History of GI bleed   . Neuropathy   . Fatigue   . Palpitations   . SOB (shortness of breath)   . OA (osteoarthritis)   . Weakness   . Forgetfulness   . Anxiety   . Depression     No current facility-administered medications on file as of 12/23/2010.   Medications Prior to Admission  Medication Sig Dispense Refill  . gabapentin (NEURONTIN) 300 MG capsule Take 300-600 mg by mouth 3 (three) times daily. 600mg  every morning; 300mg  at noon; 600mg  in the evening      . pantoprazole (PROTONIX) 40 MG tablet Take 40 mg by mouth every morning.         ADDITIONAL HOME MEDICATIONS: Alprazolam 0.25 mg by mouth every 6 hours as needed for anxiety or sleep, simvastatin 10 mg take one half tab by mouth daily at bedtime, Bactroban 2% to left  leg wound twice daily when necessary, Coumadin 2 mg tablets take one half tablet every Monday, Wednesday, and Friday and a full tablet every Tuesday, Thursday, Saturday, and Sunday, Celexa 10 mg daily, new iron 150 take one capsule daily, MiraLax 17 g daily, Colace 100 mg twice daily, vitamin E 100 units daily, multivitamin once daily, fish oil 1000 mg by mouth daily, coenzyme Q10 100 mg take one tablet daily, calcium with vitamin D twice daily, Norco 5/325 take 1-2 tabs by mouth 3 times a day when necessary mild to moderate pain, Synthroid 75 g daily, Spiriva 18 g inhalation once daily, Ritalin 5 mg every a.m., Protonix 40 mg daily, nitroglycerin 0.4 mg sublingual when necessary chest pain, Neurontin 300 mg by mouth 5 times daily, lisinopril 20 mg daily, Lasix 40 mg daily  PHYSICIANS INVOLVED IN CARE: Avie Echevaria, Delfin Edis [facility], Carman Ching, Delrae Sawyers  Past Surgical History  Procedure Date  . Umbilical hernia repair   . Appendectomy   . Knee surgery   . Tonsillectomy   . Vaginal hysterectomy   . US echocardiography 08/07/2002    EF 60-65%    Family History  Problem Relation Age of Onset  . Gallbladder disease Mother   . Pancreatic cancer Father      Social History:  reports that she has never smoked. She does not have any smokeless tobacco history on file. She reports that she does not drink  alcohol or use illicit drugs.  Allergies:  Allergies  Allergen Reactions  . Cymbalta (Duloxetine Hcl)     Unknown reaction     ROS: anemia, ankle swelling, arthritis, emphysema, heart palpitation, high blood pressure, shortness of breath, stroke and thyroid disease  PHYSICAL EXAM: Blood pressure 126/70, pulse 77, temperature 98.2 F (36.8 C), temperature source Oral, resp. rate 16, weight 66.5 kg (146 lb 9.7 oz), SpO2 96.00%. In general, the patient is a frail elderly white woman who was in no apparent distress while sitting upright on nasal cannula oxygen at  2 L/m.  She had an occasional dry cough.  HEENT exam was within normal limits and without facial droop, neck was supple without jugular venous distention or carotid bruit, chest had bilateral scattered wheezing and bibasilar crackles, heart had a slightly irregular rhythm with a systolic ejection murmur grade 2/6 at the left sternal border, abdomen had normal bowel sounds and no hepatosplenomegaly or tenderness, extremities had bilateral 1+ leg edema with bilateral palpable pedal pulses.  Skin exam was significant for shallow ulcer on the distal lateral left leg, and neurologic exam: She was alert and answered questions appropriately, she had mildly decreased hearing bilaterally, she had bilateral 4/5 quadriceps muscle strength and bilateral 5 out of 5 upper extremity strength.  Cerebellar function and gait were not assessed.  Chest x-ray in the office showed the following: Hyperexpansion of the lungs suggestive of COPD, mild cardiomegaly, increased interstitial markings in the left lung without focal airspace disease.  Next  Baseline labs from August 17, 2010: Serum sodium 141, potassium 4.3, chloride 103, carbon dioxide 29, BUN 16, creatinine 0.8, glucose 99, total protein 6.6, albumin 3.3, white blood cells 5.2, hemoglobin 10.7, hematocrit 32.2, platelets 181, TSH 1.15, 25 hydroxy vitamin D level was 44  No results found for this or any previous visit (from the past 48 hour(s)). No results found.   Assessment/Plan #1 Dyspnea: This is most likely due to pneumonia, although there is no clear focal airspace disease.  Pneumonia could be viral in origin or bacterial in origin.  Also possible is that she has developed an exacerbation of COPD or congestive heart failure.  We will check admission labs to include a CBC, complete metabolic panel, BNP test, and TSH.  We will also check a chest x-ray.  We will start her on antibiotic treatment to cover community-acquired pneumonia including Rocephin and Zithromax.   If her BNP level is not particularly elevated and her white blood cell count is also not elevated we will consider adding corticosteroids and albuterol nebulizers for COPD. #2 Atrial Fibrillation: Stable on current medications.  Her ventricular rate appears to be under good control without the use of chronotropic medications.We will check a baseline 12-lead EKG upon admission. #3 Gait Instability: She has chronic mild gait instability for which she uses a walker with a seat.  She also has evidence of bilateral quadriceps muscle weakness likely from spinal stenosis.  Over the past 2 days she has had difficulty ambulating because of her pervasive weakness.  We will request a physical therapist evaluation of her gait and an occupational therapist evaluation of her ability to perform activities of daily living. #4 Hypothyroidism: Clinically stable on current medications and we will check a TSH level. #5 Ethical Issues: In the past she had a do not resuscitate order which will be continued as an inpatient.  Yashira Offenberger G 12/23/2010, 7:30 PM

## 2010-12-23 NOTE — Progress Notes (Signed)
ANTICOAGULATION CONSULT NOTE - Initial Consult  Pharmacy Consult for Coumadin Indication: atrial fibrillation  Allergies  Allergen Reactions  . Cymbalta (Duloxetine Hcl)     Unknown reaction    Patient Measurements: Weight: 146 lb 9.7 oz (66.5 kg)  Vital Signs: Temp: 98.2 F (36.8 C) (12/14 1746) Temp src: Oral (12/14 1746) BP: 126/70 mmHg (12/14 1746) Pulse Rate: 77  (12/14 1746)  Labs:  [facility] 12/23/10 2112  HGB 7.4*  HCT 23.2*  PLT 221  APTT --  LABPROT 35.2*  INR 3.44*  HEPARINUNFRC --  CREATININE --  CKTOTAL --  CKMB --  TROPONINI --   CrCl is unknown because there is no height on file for the current visit.  Medical History: Past Medical History  Diagnosis Date  . Chronic atrial fibrillation   . History of GI bleed   . Neuropathy   . Fatigue   . Palpitations   . SOB (shortness of breath)   . OA (osteoarthritis)   . Weakness   . Forgetfulness   . Anxiety   . Depression     Medications:  Scheduled:    . azithromycin  500 mg Intravenous Q24H  . calcium-vitamin D  1 tablet Oral BID  . cefTRIAXone (ROCEPHIN)  IV  1 g Intravenous Q24H  . feeding supplement  237 mL Oral Q0700  . fish oil-omega-3 fatty acids  1 g Oral QPM  . gabapentin  300-600 mg Oral TID  . levothyroxine  75 mcg Oral Daily  . pantoprazole  40 mg Oral Q0700  . polysaccharide iron  150 mg Oral QODAY  . simvastatin  5 mg Oral QHS  . sodium chloride  3 mL Intravenous Q12H  . tiotropium  18 mcg Inhalation Daily  . warfarin  2 mg Oral Q M,W,F-1800  . warfarin  4 mg Oral Q T,Th,S,Su-1800    Assessment: 89 YOF on chronic coumadin for afib, INR supratherapeutic on admission. Coumadin home dose 2mg  Monday, Wednesday, Friday; 4mg  Tuesday, Thursday, Saturday, Sunday. pt. had h/o GIB per chart, current hgb 7.4  Goal of Therapy:  INR 2-3   Plan:  Hold Coumadin tonight F/u INR tomorrow, and watch for symptoms of bleeding  [name] 12/23/2010,10:13 PM

## 2010-12-24 ENCOUNTER — Inpatient Hospital Stay (HOSPITAL_COMMUNITY): Payer: Medicare Other

## 2010-12-24 LAB — CBC
MCH: 31 pg (ref 26.0–34.0)
MCHC: 32.6 g/dL (ref 30.0–36.0)
MCV: 95.1 fL (ref 78.0–100.0)
Platelets: 198 10*3/uL (ref 150–400)
RBC: 2.26 MIL/uL — ABNORMAL LOW (ref 3.87–5.11)

## 2010-12-24 LAB — BASIC METABOLIC PANEL
CO2: 27 mEq/L (ref 19–32)
Calcium: 8.6 mg/dL (ref 8.4–10.5)
Creatinine, Ser: 0.83 mg/dL (ref 0.50–1.10)
GFR calc non Af Amer: 57 mL/min — ABNORMAL LOW (ref >90)
Glucose, Bld: 91 mg/dL (ref 70–99)
Sodium: 140 mEq/L (ref 135–145)

## 2010-12-24 LAB — PROTIME-INR
INR: 3.49 — ABNORMAL HIGH (ref 0.00–1.49)
Prothrombin Time: 35.6 seconds — ABNORMAL HIGH (ref 11.6–15.2)

## 2010-12-24 LAB — PREPARE RBC (CROSSMATCH)

## 2010-12-24 MED ORDER — FUROSEMIDE 10 MG/ML IJ SOLN
20.0000 mg | Freq: Once | INTRAMUSCULAR | Status: AC
  Administered 2010-12-24: 20 mg via INTRAVENOUS
  Filled 2010-12-24: qty 2

## 2010-12-24 MED ORDER — FUROSEMIDE 10 MG/ML IJ SOLN
20.0000 mg | INTRAMUSCULAR | Status: AC
  Administered 2010-12-24: 20 mg via INTRAVENOUS
  Filled 2010-12-24 (×2): qty 2

## 2010-12-24 MED ORDER — FUROSEMIDE 10 MG/ML IJ SOLN
20.0000 mg | INTRAMUSCULAR | Status: DC
  Filled 2010-12-24 (×2): qty 2

## 2010-12-24 MED ORDER — FUROSEMIDE 10 MG/ML IJ SOLN
40.0000 mg | Freq: Once | INTRAMUSCULAR | Status: AC
  Administered 2010-12-24: 40 mg via INTRAVENOUS
  Filled 2010-12-24: qty 4

## 2010-12-24 MED ORDER — ACETAMINOPHEN 325 MG PO TABS
650.0000 mg | ORAL_TABLET | Freq: Once | ORAL | Status: AC
  Administered 2010-12-26: 650 mg via ORAL
  Filled 2010-12-24: qty 2

## 2010-12-24 MED ORDER — PANTOPRAZOLE SODIUM 40 MG PO TBEC
40.0000 mg | DELAYED_RELEASE_TABLET | Freq: Two times a day (BID) | ORAL | Status: DC
  Administered 2010-12-25 – 2010-12-30 (×12): 40 mg via ORAL
  Filled 2010-12-24 (×9): qty 1

## 2010-12-24 MED ORDER — FUROSEMIDE 10 MG/ML IJ SOLN
20.0000 mg | Freq: Once | INTRAMUSCULAR | Status: DC
  Filled 2010-12-24: qty 2

## 2010-12-24 NOTE — Progress Notes (Signed)
Pharmacy - Coumadin  On Coumadin PTA for Afib INR today = 3.49  Plan: 1) Continue to hold Coumadin 2) Follow up AM INR  Thank you.  Okey Regal, PharmD

## 2010-12-24 NOTE — Progress Notes (Signed)
Physical Therapy Evaluation Patient Details Name: [name] MRN: [id number] DOB: 07/01/1911 Today's Date: 12/24/2010  Problem List:  Patient Active Problem List  Diagnoses  . Dyspnea  . Atrial fibrillation  . Chronic anticoagulation  . Gait instability  . Stroke/cerebrovascular accident  . Chronic back pain  . Chronic knee pain  . Fatigue  . GERD (gastroesophageal reflux disease)  . Hypothyroidism  . Osteoporosis  . Anemia    Past Medical History:  Past Medical History  Diagnosis Date  . Chronic atrial fibrillation   . History of GI bleed   . Neuropathy   . Fatigue   . Palpitations   . SOB (shortness of breath)   . OA (osteoarthritis)   . Weakness   . Forgetfulness   . Anxiety   . Depression    Past Surgical History:  Past Surgical History  Procedure Date  . Umbilical hernia repair   . Appendectomy   . Knee surgery   . Tonsillectomy   . Vaginal hysterectomy   . US echocardiography 08/07/2002    EF 60-65%    PT Assessment/Plan/Recommendation PT Assessment Clinical Impression Statement: Pt is an intuitive 77 y/o that was admitted due to SOB and difficulty breathing.  Limited eval today due to Hgb 7.0.  Pt was asymptomatic throughout session with lowest SaO2 at 91% sitting EOB and increased to 95% at end of session.  Pt will benefit from acute PT services to improve overall mobility to prepare for safe d/c to next venue. PT Recommendation/Assessment: Patient will need skilled PT in the acute care venue PT Problem List: Decreased activity tolerance;Decreased balance;Decreased mobility;Decreased knowledge of use of DME Barriers to Discharge: Decreased caregiver support Barriers to Discharge Comments: Pt will need 24 hours assist at discharge PT Therapy Diagnosis : Difficulty walking;Abnormality of gait;Generalized weakness PT Plan PT Frequency: Min 3X/week PT Treatment/Interventions: DME instruction;Gait training;Stair training;Functional mobility  training;Therapeutic activities;Therapeutic exercise;Balance training;Patient/family education PT Recommendation Follow Up Recommendations: Skilled nursing facility;Other (comment) (Will continue to assess to determine best d/c plans) Equipment Recommended: Defer to next venue Pt currently at independent living facility.  However family concerned about finances and unsure how much longer she will be able to afford her living situation.    PT Goals  Acute Rehab PT Goals PT Goal Formulation: With patient/family Time For Goal Achievement: 2 weeks Pt will go Supine/Side to Sit: with modified independence PT Goal: Supine/Side to Sit - Progress: Progressing toward goal Pt will go Sit to Stand: with modified independence PT Goal: Sit to Stand - Progress: Progressing toward goal Pt will go Stand to Sit: with modified independence PT Goal: Stand to Sit - Progress: Progressing toward goal Pt will Transfer Bed to Chair/Chair to Bed: with modified independence PT Transfer Goal: Bed to Chair/Chair to Bed - Progress: Progressing toward goal Pt will Ambulate: >150 feet;with modified independence;with rolling walker PT Goal: Ambulate - Progress: Progressing toward goal  PT Evaluation Precautions/Restrictions  Precautions Precautions: Fall Restrictions Weight Bearing Restrictions: No Prior Functioning  Home Living Lives With: Alone Receives Help From: Other (Comment) (HH aid for 3x/wk for bathing & dressing) Type of Home: Independent living facility Home Layout: One level Home Access: Level entry Bathroom Shower/Tub: Engineer, manufacturing systems: Standard Bathroom Accessibility: Yes How Accessible: Accessible via walker Home Adaptive Equipment: Walker - rolling Prior Function Level of Independence: Independent with gait;Independent with transfers;Needs assistance with ADLs Bath: Minimal Driving: No Vocation: Retired Financial risk analyst Arousal/Alertness: Awake/alert Overall Cognitive  Status: Appears within functional limits for  tasks assessed Cognition - Other Comments: Pt HOH but alert/oriented x 4 Sensation/Coordination   Extremity Assessment RUE Assessment RUE Assessment: Within Functional Limits LUE Assessment LUE Assessment: Within Functional Limits RLE Assessment RLE Assessment: Within Functional Limits LLE Assessment LLE Assessment: Within Functional Limits Mobility (including Balance) Bed Mobility Bed Mobility: Yes Supine to Sit: HOB elevated (Comment degrees);4: Min assist Supine to Sit Details (indicate cue type and reason): (A) with trunk OOB and cues for proper technique Transfers Transfers: Yes Sit to Stand: 4: Min assist;From bed Sit to Stand Details (indicate cue type and reason): (A) to initiate transfer with cues for hand and LE placement. Stand to Sit: 4: Min assist;To chair/3-in-1 Stand to Sit Details: (A) to slowly descend to recliner.  Cues for hand placement Ambulation/Gait Ambulation/Gait: Yes Ambulation/Gait Assistance: 4: Min assist Ambulation/Gait Assistance Details (indicate cue type and reason): (A) to maintain balance and RW management.  Cues for RW position.  Pt tends to ambulate too far from RW Ambulation Distance (Feet): 5 Feet (Limited distance due to Hgb 7.0 however asymptomatic) Assistive device: Rolling walker Gait Pattern: Shuffle;Trunk flexed  Posture/Postural Control Posture/Postural Control: Postural limitations Postural Limitations: Kyphotic posture Balance Balance Assessed: Yes Static Sitting Balance Static Sitting - Balance Support: Bilateral upper extremity supported Static Sitting - Level of Assistance: 6: Modified independent (Device/Increase time) Static Sitting - Comment/# of Minutes: Pt sat EOB ~5 minutes independently. Exercise    End of Session PT - End of Session Equipment Utilized During Treatment: Gait belt Activity Tolerance: Patient limited by fatigue (PT limited eval due to Hgb 7.0) Patient  left: in chair;with call bell in reach Nurse Communication: Mobility status for ambulation;Mobility status for transfers General Behavior During Session: [facility] for tasks performed Cognition: [facility] for tasks performed  Jalessa Peyser 12/24/2010, 2:50 PM [phone]

## 2010-12-24 NOTE — Progress Notes (Signed)
Subjective: Patient no apparent distress, continues to have some coughing, shortness breath better, but diffusely weak, denies any nausea, vomiting but does admit to some black tarry stools intermittently over multiple days, unclear as to the time frame. Denies any nausea or vomiting. Does have some abdominal tenderness, and is usually on protonic at home.  Objective: Vital signs in last 24 hours: Temp:  [97.9 F (36.6 C)-98.2 F (36.8 C)] 97.9 F (36.6 C) (12/15 0620) Pulse Rate:  [70-78] 70  (12/15 0620) Resp:  [16-18] 18  (12/15 0620) BP: (126-141)/(41-80) 137/41 mmHg (12/15 0620) SpO2:  [96 %-98 %] 96 % (12/15 0620) Weight:  [66.5 kg (146 lb 9.7 oz)] 146 lb 9.7 oz (66.5 kg) (12/14 1746) Weight change:  Last BM Date: 12/22/10  CBG (last 3)  No results found for this basename: GLUCAP:3 in the last 72 hours  Intake/Output from previous day: 12/14 0701 - 12/15 0700 In: 240 [P.O.:240] Out: 350 [Urine:350] Intake/Output this shift: Total I/O In: -  Out: 400 [Urine:400] Physical exam, frail but in no apparent distress, answering questions with a weak voice. Appropriate Sclera anicteric, no oropharyngeal lesions, asymmetric, neck supple, no cervical lymphadenopathy,  cardiovascular irregularly irregular heart rate, well-controlled Abdomen soft, nondistended, bowel sounds present however subjectively mildly diffusely tender with no rebound, no suprapubic tenderness No edema, pedal pulses intact but somewhat diminished, extremities warm Alert and oriented, answering questions properly, moving all 4 extremities, decreased hearing  Lab Results:  [facility] 12/24/10 0645 12/23/10 2112  NA 140 138  K 3.9 3.7  CL 106 103  CO2 27 26  GLUCOSE 91 129*  BUN 16 19  CREATININE 0.83 0.82  CALCIUM 8.6 9.1  MG -- --  PHOS -- --    Basename 12/23/10 2112  AST 16  ALT 10  ALKPHOS 59  BILITOT 0.8  PROT 6.2  ALBUMIN 3.0*    Basename 12/24/10 0645 12/23/10 2112  WBC 6.8 7.2    NEUTROABS -- --  HGB 7.0* 7.4*  HCT 21.5* 23.2*  MCV 95.1 96.3  PLT 198 221   No results found for this basename: CKTOTAL:3,CKMB:3,CKMBINDEX:3,TROPONINI:3 in the last 72 hours  Basename 12/23/10 2112  TSH 3.001  T4TOTAL --  T3FREE --  THYROIDAB --   No results found for this basename: VITAMINB12:2,FOLATE:2,FERRITIN:2,TIBC:2,IRON:2,RETICCTPCT:2 in the last 72 hours  Studies/Results: Portable Chest X-ray 1 View  12/24/2010  *RADIOLOGY REPORT*  Clinical Data: Dyspnea.  PORTABLE CHEST - 1 VIEW  Comparison: PA and lateral chest 06/17/2010 and 09/30/2006.  Findings: There is cardiomegaly.  The patient has a small left effusion and basilar airspace disease. Indistinctness of the pulmonary vasculature is compatible with interstitial edema.  No pneumothorax.  IMPRESSION:  1.  Small left effusion and basilar airspace disease which could reflect pneumonia or atelectasis. 2.  Cardiomegaly and mild interstitial edema.  Original Report Authenticated By: Bernadene Bell. Maricela Curet, M.D.     Medications: Scheduled:   . azithromycin  500 mg Intravenous Q24H  . calcium-vitamin D  1 tablet Oral BID  . cefTRIAXone (ROCEPHIN)  IV  1 g Intravenous Q24H  . feeding supplement  237 mL Oral Q0700  . fish oil-omega-3 fatty acids  1 g Oral QPM  . gabapentin  300-600 mg Oral TID  . levothyroxine  75 mcg Oral Daily  . pantoprazole  40 mg Oral Q0700  . polysaccharide iron  150 mg Oral QODAY  . simvastatin  5 mg Oral QHS  . sodium chloride  3 mL Intravenous Q12H  .  tiotropium  18 mcg Inhalation Daily  . DISCONTD: warfarin  2 mg Oral Q M,W,F-1800  . DISCONTD: warfarin  4 mg Oral Q T,Th,S,Su-1800   Continuous:   Assessment/Plan: Principal Problem:  *Dyspnea possibly multifactorial with congestive heart failure, question pneumonia however white blood cell count is normal as well significant anemia, will attempt diuresis given elevated BNP at 4900 especially transfusion is to occur Active  Problems: Anemia-hemoglobin 11.6 in January of this year, current hemoglobin 7.4, will check FOBT, iron studies, MCV currently normal at 96.3, will discuss possible transfusion, patient is acceptable, given risk were discussed with patient, patient is maintained on Protonix at home. Will increase to twice daily  Atrial fibrillation hemodynamically stable with good rate control however PT/INR at supratherapeutic as below.  Chronic anticoagulation supratherapeutic PT/INR, Coumadin on hold  Gait instability PT and OT orders Hypothyroidism-fatigue is a problem however clinically euthyroid otherwise with normal TSH DO NOT RESUSCITATE CODE STATUS    LOS: 1 day   Thurmon Mizell R 12/24/2010, 10:25 AM

## 2010-12-25 LAB — COMPREHENSIVE METABOLIC PANEL
AST: 17 U/L (ref 0–37)
CO2: 30 mEq/L (ref 19–32)
Chloride: 99 mEq/L (ref 96–112)
Creatinine, Ser: 0.9 mg/dL (ref 0.50–1.10)
GFR calc Af Amer: 60 mL/min — ABNORMAL LOW (ref >90)
GFR calc non Af Amer: 52 mL/min — ABNORMAL LOW (ref >90)
Glucose, Bld: 111 mg/dL — ABNORMAL HIGH (ref 70–99)
Total Bilirubin: 1.2 mg/dL (ref 0.3–1.2)

## 2010-12-25 LAB — CBC
HCT: 27.6 % — ABNORMAL LOW (ref 36.0–46.0)
Hemoglobin: 9.1 g/dL — ABNORMAL LOW (ref 12.0–15.0)
MCH: 30.5 pg (ref 26.0–34.0)
MCV: 92.6 fL (ref 78.0–100.0)
Platelets: 211 10*3/uL (ref 150–400)
RBC: 2.98 MIL/uL — ABNORMAL LOW (ref 3.87–5.11)
WBC: 5.7 10*3/uL (ref 4.0–10.5)

## 2010-12-25 LAB — LEGIONELLA ANTIGEN, URINE: Legionella Antigen, Urine: NEGATIVE

## 2010-12-25 MED ORDER — POTASSIUM CHLORIDE CRYS ER 20 MEQ PO TBCR
20.0000 meq | EXTENDED_RELEASE_TABLET | Freq: Two times a day (BID) | ORAL | Status: AC
  Administered 2010-12-25 – 2010-12-26 (×4): 20 meq via ORAL
  Filled 2010-12-25 (×5): qty 1

## 2010-12-25 MED ORDER — FUROSEMIDE 10 MG/ML IJ SOLN
40.0000 mg | Freq: Every day | INTRAMUSCULAR | Status: DC
  Administered 2010-12-25 – 2010-12-30 (×6): 40 mg via INTRAVENOUS
  Filled 2010-12-25 (×6): qty 4

## 2010-12-25 MED ORDER — WARFARIN SODIUM 2 MG PO TABS
2.0000 mg | ORAL_TABLET | Freq: Once | ORAL | Status: AC
  Administered 2010-12-25: 2 mg via ORAL
  Filled 2010-12-25: qty 1

## 2010-12-25 NOTE — Significant Event (Signed)
Pt's BP was 177/90 after receiving 1st unit PRBC's despite 20 mg IV Lasix given before and after unit transfused.  Pt has some expiratory wheeze and rales noted LLL.  Pt has saturated bed x 2 and refuses to take anymore Lasix today without a foley catheter in place.  MD on-call, Dr. Kevan Ny notified and ordered 40 mg. IV Lasix now and placement of a foley catheter right now as pt unable to presently tolerate getting OOB without decompensating(weak and further putting pt at risk for falls).  Lasix given and F/C inserted.  Pt put out 1L of urine right away.  Will cont. To monitor.

## 2010-12-25 NOTE — Progress Notes (Signed)
Pharmacy - Coumadin  On Coumadin PTA for Afib INR today = 2.73  Plan: 1) Coumadin 2 mg po x 1 dose today 2) Follow up AM INR  Thank you.  Okey Regal, PharmD

## 2010-12-25 NOTE — Progress Notes (Signed)
Subjective: Per nursing notes, patient with elevated blood pressure and signs and evidence of bronchospasm and possibly volume overload with blood transfusion, Foley catheter as well as IV Lasix at higher doses ordered.Today energy the same Eating no GI discomfort  Objective: Vital signs in last 24 hours: Temp:  [97.5 F (36.4 C)-99.2 F (37.3 C)] 98.5 F (36.9 C) (12/16 0510) Pulse Rate:  [70-83] 80  (12/16 0510) Resp:  [16-20] 16  (12/16 0510) BP: (131-177)/(60-90) 139/71 mmHg (12/16 0510) SpO2:  [92 %-96 %] 93 % (12/16 0510) Weight:  [63.05 kg (139 lb)] 139 lb (63.05 kg) (12/16 0429) Weight change: -3.45 kg (-7 lb 9.7 oz) Last BM Date: 12/22/10  CBG (last 3)  No results found for this basename: GLUCAP:3 in the last 72 hours  Intake/Output from previous day: 12/15 0701 - 12/16 0700 In: 1952.5 [P.O.:720; I.V.:250; Blood:982.5] Out: 4500 [Urine:4500] Intake/Output this shift: Total I/O In: 240 [P.O.:240] Out: 1500 [Urine:1500]  Extremities: no clubbing, cyanosis or edema Physical exam, frail but in no apparent distress, answering questions with a weak voice. Appropriate  Sclera anicteric, no oropharyngeal lesions, asymmetric, neck supple, no cervical lymphadenopathy,  cardiovascular irregularly irregular heart rate, well-controlled  Abdomen soft, nondistended, bowel sounds present however subjectively mildly diffusely tender with no rebound, no suprapubic tenderness  No edema, pedal pulses intact but somewhat diminished, extremities warm  Alert and oriented, answering questions properly, moving all 4 extremities, decreased hearing   Lab Results:  [facility] 12/25/10 0500 12/24/10 0645  NA 139 140  K 3.1* 3.9  CL 99 106  CO2 30 27  GLUCOSE 111* 91  BUN 15 16  CREATININE 0.90 0.83  CALCIUM 8.9 8.6  MG -- --  PHOS -- --    Basename 12/25/10 0500 12/23/10 2112  AST 17 16  ALT 10 10  ALKPHOS 68 59  BILITOT 1.2 0.8  PROT 6.2 6.2  ALBUMIN 3.0* 3.0*    Basename  12/25/10 0500 12/24/10 0645  WBC 5.7 6.8  NEUTROABS -- --  HGB 9.1* 7.0*  HCT 27.6* 21.5*  MCV 92.6 95.1  PLT 211 198   No results found for this basename: CKTOTAL:3,CKMB:3,CKMBINDEX:3,TROPONINI:3 in the last 72 hours  Basename 12/23/10 2112  TSH 3.001  T4TOTAL --  T3FREE --  THYROIDAB --   No results found for this basename: VITAMINB12:2,FOLATE:2,FERRITIN:2,TIBC:2,IRON:2,RETICCTPCT:2 in the last 72 hours  Studies/Results: Portable Chest X-ray 1 View  12/24/2010  *RADIOLOGY REPORT*  Clinical Data: Dyspnea.  PORTABLE CHEST - 1 VIEW  Comparison: PA and lateral chest 06/17/2010 and 09/30/2006.  Findings: There is cardiomegaly.  The patient has a small left effusion and basilar airspace disease. Indistinctness of the pulmonary vasculature is compatible with interstitial edema.  No pneumothorax.  IMPRESSION:  1.  Small left effusion and basilar airspace disease which could reflect pneumonia or atelectasis. 2.  Cardiomegaly and mild interstitial edema.  Original Report Authenticated By: Bernadene Bell. Maricela Curet, M.D.     Medications: Scheduled:   . acetaminophen  650 mg Oral Once  . azithromycin  500 mg Intravenous Q24H  . calcium-vitamin D  1 tablet Oral BID  . cefTRIAXone (ROCEPHIN)  IV  1 g Intravenous Q24H  . feeding supplement  237 mL Oral Q0700  . fish oil-omega-3 fatty acids  1 g Oral QPM  . furosemide  20 mg Intravenous Once  . furosemide  20 mg Intravenous Once  . furosemide  20 mg Intravenous Q4H  . furosemide  40 mg Intravenous Once  . gabapentin  300-600  mg Oral TID  . levothyroxine  75 mcg Oral Daily  . pantoprazole  40 mg Oral BID AC  . polysaccharide iron  150 mg Oral QODAY  . simvastatin  5 mg Oral QHS  . sodium chloride  3 mL Intravenous Q12H  . tiotropium  18 mcg Inhalation Daily   Continuous:   Assessment/Plan: Principal Problem:  *Dyspnea possibly multifactorial with congestive heart failure, question pneumonia however white blood cell count is normal as well  significant anemia, will attempt further diuresis given elevated BNP at 4900 especially after transfusion. Active Problems:  Anemia-hemoglobin 11.6 in January of this year, current hemoglobin 7.0, will check FOBT, iron studies, MCV currently normal at 96.3,pt has received 2 u pRBC; Hgb Better, patient is maintained on Protonix at home. Will increase to twice daily  Atrial fibrillation hemodynamically stable with good rate control however PT/INR at supratherapeutic as below.Coumadin on hold.  Chronic anticoagulation supratherapeutic PT/INR, Coumadin on hold  Gait instability PT and OT orders  Hypothyroidism-fatigue is a problem however clinically euthyroid otherwise with normal TSH  Hypokalemia presumably secondary to diuresis will replete DO NOT RESUSCITATE CODE STATUS       LOS: 2 days   Omega Durante R 12/25/2010, 11:50 AM

## 2010-12-26 LAB — COMPREHENSIVE METABOLIC PANEL
AST: 13 U/L (ref 0–37)
CO2: 31 mEq/L (ref 19–32)
Calcium: 8.9 mg/dL (ref 8.4–10.5)
Creatinine, Ser: 0.94 mg/dL (ref 0.50–1.10)
GFR calc Af Amer: 57 mL/min — ABNORMAL LOW (ref >90)
GFR calc non Af Amer: 49 mL/min — ABNORMAL LOW (ref >90)
Glucose, Bld: 95 mg/dL (ref 70–99)
Total Protein: 6.3 g/dL (ref 6.0–8.3)

## 2010-12-26 LAB — TYPE AND SCREEN
ABO/RH(D): O POS
Antibody Screen: NEGATIVE
Unit division: 0

## 2010-12-26 LAB — GLUCOSE, CAPILLARY: Glucose-Capillary: 100 mg/dL — ABNORMAL HIGH (ref 70–99)

## 2010-12-26 LAB — CBC
Hemoglobin: 9.7 g/dL — ABNORMAL LOW (ref 12.0–15.0)
RBC: 3.2 MIL/uL — ABNORMAL LOW (ref 3.87–5.11)

## 2010-12-26 LAB — PROTIME-INR: INR: 1.9 — ABNORMAL HIGH (ref 0.00–1.49)

## 2010-12-26 MED ORDER — WARFARIN SODIUM 2 MG PO TABS
2.0000 mg | ORAL_TABLET | Freq: Once | ORAL | Status: AC
  Administered 2010-12-26: 2 mg via ORAL
  Filled 2010-12-26: qty 1

## 2010-12-26 NOTE — Progress Notes (Signed)
ANTICOAGULATION CONSULT NOTE - Follow Up Consult  Pharmacy Consult for Coumadin Indication: atrial fibrillation  Allergies  Allergen Reactions  . Cymbalta (Duloxetine Hcl)     Unknown reaction    Patient Measurements: Height: 5\' 3"  (160 cm) Weight: 138 lb 12.8 oz (62.959 kg) IBW/kg (Calculated) : 52.4  Adjusted Body Weight:   Vital Signs: Temp: 101.6 F (38.7 C) (12/17 1321) Temp src: Oral (12/17 1321) BP: 108/63 mmHg (12/17 1321) Pulse Rate: 86  (12/17 1321)  Labs:  Basename 12/26/10 0705 12/25/10 0500 12/24/10 0645  HGB 9.7* 9.1* --  HCT 29.9* 27.6* 21.5*  PLT 225 211 198  APTT -- -- --  LABPROT 22.1* 29.4* 35.6*  INR 1.90* 2.73* 3.49*  HEPARINUNFRC -- -- --  CREATININE 0.94 0.90 0.83  CKTOTAL -- -- --  CKMB -- -- --  TROPONINI -- -- --   Estimated Creatinine Clearance: 30.6 ml/min (by C-G formula based on Cr of 0.94).    Assessment: Patient is a 77 y.o F on Coumadin for Afib.  Coumadin resumed at 2mg  last night with INR decreased from 2.73 to 1.90 today.  Drop in INR is probably still reflective of dose held for a couple of days due to supratherapeutic INRs.  Goal of Therapy:  INR 2-3   Plan:  1) Repeat Coumadin 2mg  PO x1 today 2) Will plan on increasing dose if INR cont to decrease tomorrow.  Dorna Leitz P 12/26/2010,3:08 PM

## 2010-12-26 NOTE — Progress Notes (Signed)
Occupational Therapy Evaluation Patient Details Name: [name] MRN: [id number] DOB: 1911-04-10 Today's Date: 12/26/2010  Problem List:  Patient Active Problem List  Diagnoses  . Dyspnea  . Atrial fibrillation  . Chronic anticoagulation  . Gait instability  . Stroke/cerebrovascular accident  . Chronic back pain  . Chronic knee pain  . Fatigue  . GERD (gastroesophageal reflux disease)  . Hypothyroidism  . Osteoporosis  . Anemia    Past Medical History:  Past Medical History  Diagnosis Date  . Chronic atrial fibrillation   . History of GI bleed   . Neuropathy   . Fatigue   . Palpitations   . SOB (shortness of breath)   . OA (osteoarthritis)   . Weakness   . Forgetfulness   . Anxiety   . Depression    Past Surgical History:  Past Surgical History  Procedure Date  . Umbilical hernia repair   . Appendectomy   . Knee surgery   . Tonsillectomy   . Vaginal hysterectomy   . US echocardiography 08/07/2002    EF 60-65%    OT Assessment/Plan/Recommendation OT Assessment Clinical Impression Statement: Pt. will benefit from OT to increase independence with ADLs to a supervision level prior to D/C home. OT Recommendation/Assessment: Patient will need skilled OT in the acute care venue OT Problem List: Decreased strength;Decreased activity tolerance;Impaired balance (sitting and/or standing);Decreased safety awareness;Decreased knowledge of use of DME or AE;Cardiopulmonary status limiting activity Barriers to Discharge: None OT Therapy Diagnosis : Generalized weakness OT Plan OT Frequency: Min 2X/week OT Treatment/Interventions: Self-care/ADL training;Energy conservation;Therapeutic activities;Patient/family education;Balance training OT Recommendation Follow Up Recommendations: Skilled nursing facility;24 hour supervision/assistance Equipment Recommended: Defer to next venue Individuals Consulted Consulted and Agree with Results and Recommendations:  Patient OT Goals Acute Rehab OT Goals OT Goal Formulation: With patient Time For Goal Achievement: 2 weeks ADL Goals Pt Will Perform Grooming: with set-up;with supervision;Standing at sink ADL Goal: Grooming - Progress: Progressing toward goals Pt Will Perform Lower Body Bathing: Sit to stand from bed;with min assist ADL Goal: Lower Body Bathing - Progress: Not addressed Pt Will Transfer to Toilet: with supervision;Regular height toilet;Ambulation ADL Goal: Toilet Transfer - Progress: Progressing toward goals Pt Will Perform Toileting - Hygiene: with supervision;with set-up;Sit to stand from 3-in-1/toilet ADL Goal: Toileting - Hygiene - Progress: Progressing toward goals  OT Evaluation Precautions/Restrictions  Precautions Precautions: Fall Restrictions Weight Bearing Restrictions: No Prior Functioning Home Living Lives With: Alone Receives Help From: Other (Comment) Type of Home: Independent living facility Home Layout: One level Home Access: Level entry Bathroom Shower/Tub: Engineer, manufacturing systems: Standard Bathroom Accessibility: Yes How Accessible: Accessible via walker Home Adaptive Equipment: Walker - rolling Prior Function Level of Independence: Independent with gait;Independent with transfers;Needs assistance with ADLs Bath: Minimal Driving: No Vocation: Retired Leisure: Hobbies-yes (Comment) Comments: Cooking ADL ADL Eating/Feeding: Simulated;Set up Where Assessed - Eating/Feeding: Chair Grooming: Performed;Wash/dry hands;Wash/dry face;Brushing hair;Minimal assistance;Set up Where Assessed - Grooming: Standing at sink Upper Body Bathing: Simulated;Chest;Right arm;Left arm;Abdomen;Moderate assistance Where Assessed - Upper Body Bathing: Unsupported;Sitting, bed Lower Body Bathing: Simulated;Maximal assistance Where Assessed - Lower Body Bathing: Sit to stand from bed Upper Body Dressing: Performed;Minimal assistance Upper Body Dressing Details  (indicate cue type and reason): With donning gown Where Assessed - Upper Body Dressing: Unsupported;Sitting, bed Lower Body Dressing: Simulated;Maximal assistance Where Assessed - Lower Body Dressing: Sit to stand from bed Toilet Transfer: Simulated;Moderate assistance Toilet Transfer Details (indicate cue type and reason): With mod verbal cues for hand placement and technique  Toilet Transfer Method: Surveyor, minerals: Other (comment) (with recliner) Toileting - Clothing Manipulation: Simulated;Minimal assistance Toileting - Clothing Manipulation Details (indicate cue type and reason): With moving gown Where Assessed - Toileting Clothing Manipulation: Standing Toileting - Hygiene: Simulated;Minimal assistance Where Assessed - Toileting Hygiene: Standing Tub/Shower Transfer: Not assessed Tub/Shower Transfer Method: Not assessed Equipment Used: Rolling walker ADL Comments: Pt. recently finished bathing and reports increased fatigue. Pt. completed ambulation to chair from bed with minimal assist due to decreased activity tolerance and decreased balance. ~6' with RW. Vision/Perception  Vision - History Baseline Vision: No visual deficits Patient Visual Report: No change from baseline Vision - Assessment Eye Alignment: Within Functional Limits Vision Assessment: Vision not tested Cognition Cognition Arousal/Alertness: Awake/alert Overall Cognitive Status: Appears within functional limits for tasks assessed Orientation Level: Oriented X4 Cognition - Other Comments: Pt HOH but alert/oriented x 4 Sensation/Coordination Sensation Light Touch: Appears Intact Stereognosis: Not tested Hot/Cold: Not tested Proprioception: Not tested Coordination Gross Motor Movements are Fluid and Coordinated: Yes Fine Motor Movements are Fluid and Coordinated: Yes Extremity Assessment RUE Assessment RUE Assessment: Within Functional Limits LUE Assessment LUE Assessment: Within  Functional Limits Mobility  Bed Mobility Bed Mobility: No Transfers Transfers: Yes Sit to Stand: 4: Min assist;From bed Sit to Stand Details (indicate cue type and reason): Mod verbal cues for hand placement and to facilitate anterior weightshift due to posterior lean Stand to Sit: 4: Min assist;To chair/3-in-1 Exercises   End of Session OT - End of Session Equipment Utilized During Treatment: Gait belt Activity Tolerance: Patient tolerated treatment well;Patient limited by fatigue Patient left: in chair;with call bell in reach;with family/visitor present Nurse Communication: Mobility status for transfers General Behavior During Session: [facility] for tasks performed Cognition: [facility] for tasks performed   Jessaca Philippi, OTR/L Pager [phone] 12/26/2010, 10:38 AM

## 2010-12-26 NOTE — Progress Notes (Signed)
Subjective: Her breathing is improved somewhat.  Her appetite is good.  She is not having shortness of breath at rest, and she is not having a productive cough or chest pain.  Objective: Vital signs in last 24 hours: Temp:  [97.7 F (36.5 C)-101.6 F (38.7 C)] 101.6 F (38.7 C) (12/17 1321) Pulse Rate:  [65-86] 86  (12/17 1321) Resp:  [18-20] 18  (12/17 1321) BP: (108-128)/(63-70) 108/63 mmHg (12/17 1321) SpO2:  [95 %-97 %] 96 % (12/17 1321) Weight:  [62.959 kg (138 lb 12.8 oz)] 138 lb 12.8 oz (62.959 kg) (12/17 0359) Weight change: -0.091 kg (-3.2 oz)   Intake/Output from previous day: 12/16 0701 - 12/17 0700 In: 720 [P.O.:720] Out: 3700 [Urine:3700]   General appearance: alert, cooperative and no distress Resp: clear to auscultation bilaterally Cardio: irregularly irregular rhythm Extremities: extremities normal, atraumatic, no cyanosis or edema Neurologic: Grossly normal  Lab Results:  Basename 12/26/10 0705 12/25/10 0500  WBC 7.9 5.7  HGB 9.7* 9.1*  HCT 29.9* 27.6*  PLT 225 211   BMET  Basename 12/26/10 0705 12/25/10 0500  NA 138 139  K 3.8 3.1*  CL 99 99  CO2 31 30  GLUCOSE 95 111*  BUN 16 15  CREATININE 0.94 0.90  CALCIUM 8.9 8.9   CMET CMP     Component Value Date/Time   NA 138 12/26/2010 0705   K 3.8 12/26/2010 0705   CL 99 12/26/2010 0705   CO2 31 12/26/2010 0705   GLUCOSE 95 12/26/2010 0705   BUN 16 12/26/2010 0705   CREATININE 0.94 12/26/2010 0705   CALCIUM 8.9 12/26/2010 0705   PROT 6.3 12/26/2010 0705   ALBUMIN 2.8* 12/26/2010 0705   AST 13 12/26/2010 0705   ALT 7 12/26/2010 0705   ALKPHOS 68 12/26/2010 0705   BILITOT 0.8 12/26/2010 0705   GFRNONAA 49* 12/26/2010 0705   GFRAA 57* 12/26/2010 0705    CBG (last 3)   Basename 12/26/10 0516  GLUCAP 100*    INR RESULTS:   Lab Results  Component Value Date   INR 1.90* 12/26/2010   INR 2.73* 12/25/2010   INR 3.49* 12/24/2010     Studies/Results: No results  found.  Medications: I have reviewed the patient's current medications.  Assessment/Plan: #1 Dyspnea:  Improved on current meds. We will continue current meds and recheck labs tomorrow. She should be ready for discharge to home or to a SNF within the next 48 hours. We will check an echo to assess LV function.  #2 Anemia: improved after transfusion. #3 Atrial Fibrillation:  Stable on current meds.   LOS: 3 days   Keldrick Pomplun G 12/26/2010, 6:43 PM

## 2010-12-26 NOTE — Progress Notes (Addendum)
UR Completed.  [name] 12/26/2010 [phone]  

## 2010-12-27 DIAGNOSIS — E46 Unspecified protein-calorie malnutrition: Secondary | ICD-10-CM | POA: Diagnosis present

## 2010-12-27 DIAGNOSIS — I509 Heart failure, unspecified: Secondary | ICD-10-CM | POA: Diagnosis present

## 2010-12-27 DIAGNOSIS — R5381 Other malaise: Secondary | ICD-10-CM | POA: Diagnosis present

## 2010-12-27 LAB — PROTIME-INR
INR: 1.74 — ABNORMAL HIGH (ref 0.00–1.49)
Prothrombin Time: 20.7 seconds — ABNORMAL HIGH (ref 11.6–15.2)

## 2010-12-27 LAB — CBC
HCT: 29.6 % — ABNORMAL LOW (ref 36.0–46.0)
Hemoglobin: 9.6 g/dL — ABNORMAL LOW (ref 12.0–15.0)
MCH: 30.1 pg (ref 26.0–34.0)
MCHC: 32.4 g/dL (ref 30.0–36.0)
MCV: 92.8 fL (ref 78.0–100.0)
RBC: 3.19 MIL/uL — ABNORMAL LOW (ref 3.87–5.11)

## 2010-12-27 LAB — COMPREHENSIVE METABOLIC PANEL
ALT: 6 U/L (ref 0–35)
AST: 14 U/L (ref 0–37)
Albumin: 2.6 g/dL — ABNORMAL LOW (ref 3.5–5.2)
Calcium: 8.7 mg/dL (ref 8.4–10.5)
Creatinine, Ser: 0.98 mg/dL (ref 0.50–1.10)
GFR calc non Af Amer: 47 mL/min — ABNORMAL LOW (ref >90)
Sodium: 136 mEq/L (ref 135–145)
Total Protein: 6.2 g/dL (ref 6.0–8.3)

## 2010-12-27 LAB — PRO B NATRIURETIC PEPTIDE: Pro B Natriuretic peptide (BNP): 1916 pg/mL — ABNORMAL HIGH (ref 0–450)

## 2010-12-27 MED ORDER — AZITHROMYCIN 500 MG PO TABS
500.0000 mg | ORAL_TABLET | Freq: Every day | ORAL | Status: AC
  Administered 2010-12-27 – 2010-12-29 (×3): 500 mg via ORAL
  Filled 2010-12-27 (×3): qty 1

## 2010-12-27 MED ORDER — WARFARIN SODIUM 4 MG PO TABS
4.0000 mg | ORAL_TABLET | Freq: Once | ORAL | Status: AC
  Administered 2010-12-27: 4 mg via ORAL
  Filled 2010-12-27: qty 1

## 2010-12-27 NOTE — Progress Notes (Signed)
Physical Therapy Treatment Patient Details Name: [name] MRN: [id number] DOB: [birth year] Today's Date: 12/27/2010  PT Assessment/Plan  PT - Assessment/Plan Comments on Treatment Session: Pt seems to be declining in mobility status, requiring up to Max A for transfers. Tx focused on mobility, increasing activity tolerance, LE strengthening, and short distance amb. Pt quite frustrated with current status and DC disposition.  PT Plan: Discharge plan remains appropriate PT Frequency: Min 3X/week Follow Up Recommendations: Skilled nursing facility;Other (comment) Equipment Recommended: Defer to next venue PT Goals  Acute Rehab PT Goals PT Goal: Supine/Side to Sit - Progress: Progressing toward goal PT Goal: Sit to Stand - Progress: Progressing toward goal PT Goal: Stand to Sit - Progress: Progressing toward goal PT Transfer Goal: Bed to Chair/Chair to Bed - Progress: Progressing toward goal PT Goal: Ambulate - Progress: Progressing toward goal  PT Treatment Precautions/Restrictions  Precautions Precautions: Fall Restrictions Weight Bearing Restrictions: No Mobility (including Balance) Bed Mobility Bed Mobility: Yes Supine to Sit: HOB elevated (Comment degrees);4: Min assist Supine to Sit Details (indicate cue type and reason): A for trunk Transfers Transfers: Yes Sit to Stand: 3: Mod assist;With upper extremity assist;From bed Sit to Stand Details (indicate cue type and reason): Cues to initiate, A for lifting Stand to Sit: 4: Min assist;To chair/3-in-1 Stand to Sit Details: Cues for hand placement, A to control descent Stand Pivot Transfers: 2: Max Actuary Details (indicate cue type and reason): A for hand-over-hand placement and for lifitng/guiding Ambulation/Gait Ambulation/Gait: Yes Ambulation/Gait Assistance: 3: Mod assist Ambulation/Gait Assistance Details (indicate cue type and reason): Cues for gait pattern, continous steps, hands on RW  grips, and posture. Distance limited due to weakness, declined further gait Ambulation Distance (Feet): 5 Feet Assistive device: Rolling walker Gait Pattern: Shuffle;Trunk flexed Stairs: No Wheelchair Mobility Wheelchair Mobility: No  Posture/Postural Control Posture/Postural Control: Postural limitations Postural Limitations: Kyphotic posture Balance Balance Assessed: Yes Static Sitting Balance Static Sitting - Balance Support: Bilateral upper extremity supported Static Sitting - Level of Assistance: 5: Stand by assistance Static Standing Balance Static Standing - Balance Support: Bilateral upper extremity supported Static Standing - Level of Assistance: 4: Min assist Static Standing - Comment/# of Minutes: 90 sec to increase standing tolerance and functional strength with RW, cues for posture Exercise  General Exercises - Lower Extremity Ankle Circles/Pumps: 20 reps;Seated;Both;Strengthening;AROM Long Arc Quad: 15 reps;Seated;Both;AROM;Strengthening Hip Flexion/Marching: 10 reps;Seated;Both;AROM;Strengthening End of Session PT - End of Session Equipment Utilized During Treatment: Gait belt Activity Tolerance: Patient limited by fatigue Patient left: in chair;with call bell in reach Nurse Communication: Mobility status for ambulation;Mobility status for transfers General Behavior During Session: [facility] for tasks performed Cognition: Impaired, at baseline  [facility] [place], [place] [phone]  12/27/2010, 4:04 PM

## 2010-12-27 NOTE — Progress Notes (Signed)
ANTICOAGULATION CONSULT NOTE - Follow Up Consult  Pharmacy Consult for Coumadin Indication: atrial fibrillation  Allergies  Allergen Reactions  . Cymbalta (Duloxetine Hcl)     Unknown reaction    Patient Measurements: Height: 5\' 3"  (160 cm) Weight: 136 lb 14.5 oz (62.1 kg) IBW/kg (Calculated) : 52.4   Vital Signs: Temp: 99.3 F (37.4 C) (12/18 0646) Temp src: Oral (12/18 0646) BP: 95/58 mmHg (12/18 0646) Pulse Rate: 78  (12/18 0646)  Labs:  Basename 12/27/10 0529 12/26/10 0705 12/25/10 0500  HGB 9.6* 9.7* --  HCT 29.6* 29.9* 27.6*  PLT 235 225 211  APTT -- -- --  LABPROT 20.7* 22.1* 29.4*  INR 1.74* 1.90* 2.73*  HEPARINUNFRC -- -- --  CREATININE 0.98 0.94 0.90  CKTOTAL -- -- --  CKMB -- -- --  TROPONINI -- -- --   Estimated Creatinine Clearance: 27.1 ml/min (by C-G formula based on Cr of 0.98).    Assessment: Patient is a 77 y.o F on Coumadin for Afib.  INR continues to decrease after 2 doses of 2mg .  Drop in INR is probably still reflective of dose held for a couple of days due to supratherapeutic INRs. PTA dose was 4mg  on TTSS/ 2mg  MWF.  Goal of Therapy:  INR 2-3   Plan:  1) Increase Coumadin 4mg  PO x1 today - consistent with PTA regimen. F/U am labs. 2) Changed Zithromax for PNA to po per P&T policy (pt clinically improved, afebrile, WBC normal, tolerating po meds) to complete 7 day course.  Elson Clan 12/27/2010,9:11 AM

## 2010-12-27 NOTE — Progress Notes (Signed)
Subjective: She continues to have global weakness and has not been walking much. Her appetite is fair. Today she had a normal bowel movement. She denies productive cough or dyspnea at rest.   Objective: Vital signs in last 24 hours: Temp:  [98.2 F (36.8 C)-101.6 F (38.7 C)] 99.3 F (37.4 C) (12/18 0646) Pulse Rate:  [78-86] 78  (12/18 0646) Resp:  [14-18] 14  (12/18 0646) BP: (93-108)/(52-63) 95/58 mmHg (12/18 0646) SpO2:  [90 %-96 %] 92 % (12/18 0646) Weight:  [62.1 kg (136 lb 14.5 oz)] 136 lb 14.5 oz (62.1 kg) (12/18 0646) Weight change: -0.859 kg (-1 lb 14.3 oz)   Intake/Output from previous day: 12/17 0701 - 12/18 0700 In: 960 [P.O.:960] Out: 2500 [Urine:2500]   General appearance: alert, cooperative and no distress Resp: minimal bibasilar crackles Cardio: regular rate and rhythm with a 2/6 SEM GI: soft, non-tender; bowel sounds normal; no masses,  no organomegaly Extremities: extremities normal, atraumatic, no cyanosis or edema  Lab Results:  Basename 12/27/10 0529 12/26/10 0705  WBC 9.5 7.9  HGB 9.6* 9.7*  HCT 29.6* 29.9*  PLT 235 225   BMET  Basename 12/27/10 0529 12/26/10 0705  NA 136 138  K 3.7 3.8  CL 98 99  CO2 28 31  GLUCOSE 100* 95  BUN 20 16  CREATININE 0.98 0.94  CALCIUM 8.7 8.9   CMET CMP     Component Value Date/Time   NA 136 12/27/2010 0529   K 3.7 12/27/2010 0529   CL 98 12/27/2010 0529   CO2 28 12/27/2010 0529   GLUCOSE 100* 12/27/2010 0529   BUN 20 12/27/2010 0529   CREATININE 0.98 12/27/2010 0529   CALCIUM 8.7 12/27/2010 0529   PROT 6.2 12/27/2010 0529   ALBUMIN 2.6* 12/27/2010 0529   AST 14 12/27/2010 0529   ALT 6 12/27/2010 0529   ALKPHOS 63 12/27/2010 0529   BILITOT 1.0 12/27/2010 0529   GFRNONAA 47* 12/27/2010 0529   GFRAA 54* 12/27/2010 0529    CBG (last 3)   Basename 12/26/10 0516  GLUCAP 100*    INR RESULTS:   Lab Results  Component Value Date   INR 1.74* 12/27/2010   INR 1.90* 12/26/2010   INR 2.73*  12/25/2010     Studies/Results: No results found.  Medications: I have reviewed the patient's current medications.  Assessment/Plan: #1 Pneumonia: improved on current rx. Has low grade fever without leukocytosis. #2 CHF:  From diastolic dysfunction and slowly improving. We will check an echo today and recheck BNP and CMET tomorrow am. #3 Protein Calorie Malnutrition: moderately severe and will need nutrition supplements. #4 Pysical Deconditioning:  Moderately severe and she agrees to look into shortterm SNF rehab as she lives independently.   LOS: 4 days   Saahil Herbster G 12/27/2010, 12:37 PM

## 2010-12-27 NOTE — Plan of Care (Signed)
Problem: Phase II Progression Outcomes Goal: Progress activity as tolerated unless otherwise ordered Outcome: Progressing Pt continues to participate in OOB activity, but ambulation limited due to weakness and fatigue.   CommentsClydene Laming, [place] [phone] 12/27/2010 16:06

## 2010-12-28 ENCOUNTER — Other Ambulatory Visit: Payer: Self-pay

## 2010-12-28 DIAGNOSIS — I369 Nonrheumatic tricuspid valve disorder, unspecified: Secondary | ICD-10-CM

## 2010-12-28 LAB — COMPREHENSIVE METABOLIC PANEL
ALT: 7 U/L (ref 0–35)
Alkaline Phosphatase: 60 U/L (ref 39–117)
Chloride: 100 mEq/L (ref 96–112)
GFR calc Af Amer: 56 mL/min — ABNORMAL LOW (ref >90)
Glucose, Bld: 100 mg/dL — ABNORMAL HIGH (ref 70–99)
Potassium: 3.7 mEq/L (ref 3.5–5.1)
Sodium: 138 mEq/L (ref 135–145)
Total Bilirubin: 0.7 mg/dL (ref 0.3–1.2)
Total Protein: 6.1 g/dL (ref 6.0–8.3)

## 2010-12-28 LAB — CBC
HCT: 28.5 % — ABNORMAL LOW (ref 36.0–46.0)
MCHC: 33 g/dL (ref 30.0–36.0)
MCV: 93.1 fL (ref 78.0–100.0)
Platelets: 237 10*3/uL (ref 150–400)
RDW: 16.7 % — ABNORMAL HIGH (ref 11.5–15.5)

## 2010-12-28 MED ORDER — WARFARIN SODIUM 4 MG PO TABS
4.0000 mg | ORAL_TABLET | Freq: Once | ORAL | Status: AC
  Administered 2010-12-28: 4 mg via ORAL
  Filled 2010-12-28: qty 1

## 2010-12-28 NOTE — Clinical Documentation Improvement (Signed)
CHF DOCUMENTATION CLARIFICATION QUERY  THIS DOCUMENT IS NOT A PERMANENT PART OF THE MEDICAL RECORD  TO RESPOND TO THE THIS QUERY, FOLLOW THE INSTRUCTIONS BELOW:  1. If needed, update documentation for the patient's encounter via the notes activity.  2. Access this query again and click edit on the In [employer].  3. After updating, or not, click F2 to complete all highlighted (required) fields concerning your review. Select "additional documentation in the medical record" OR "no additional documentation provided".  4. Click Sign note button.  5. The deficiency will fall out of your In Basket *Please let us know if you are not able to complete this workflow by phone or e-mail (listed below).  Please update your documentation within the medical record to reflect your response to this query.                                                                                    12/28/10  Dear Dr.[facility],  In a better effort to capture your patient's severity of illness, reflect appropriate length of stay and utilization of resources, a review of the patient medical record has revealed the following indicators the diagnosis of Heart Failure.    Based on your clinical judgment, please clarify and document in a progress note and/or discharge summary the clinical condition associated with the following supporting information:   Possible Clinical Conditions?  Chronic Systolic Congestive Heart Failure Chronic Diastolic Congestive Heart Failure Chronic Systolic & Diastolic Congestive Heart Failure Acute Systolic Congestive Heart Failure Acute Diastolic Congestive Heart Failure Acute Systolic & Diastolic Congestive Heart Failure Acute on Chronic Systolic Congestive Heart Failure Acute on Chronic Diastolic Congestive Heart Failure Acute on Chronic Systolic & Diastolic  Congestive Heart Failure Other Condition________________________________________ Cannot Clinically  Determine   Risk Factors: Signs & Symptoms: CHF from diastolic dysfunction, slowly improving per 12/18 progress note. Diagnostics: BNP on 12/18:  1916.0 BNP on 12/14:  4898.0   Reviewed: Responded in progress notes.  Thank You,  Marciano Sequin,  Clinical Documentation Specialist:  Pager: [phone]  Health Information Management [place]

## 2010-12-28 NOTE — Progress Notes (Signed)
ANTICOAGULATION CONSULT NOTE - Follow Up Consult  Pharmacy Consult for Coumadin Indication: atrial fibrillation  Allergies  Allergen Reactions  . Cymbalta (Duloxetine Hcl)     Unknown reaction    Patient Measurements: Height: 5\' 3"  (160 cm) Weight: 136 lb 14.5 oz (62.1 kg) IBW/kg (Calculated) : 52.4  Adjusted Body Weight:   Vital Signs: Temp: 99.2 F (37.3 C) (12/19 0422) Temp src: Oral (12/19 0422) BP: 103/62 mmHg (12/19 0422) Pulse Rate: 75  (12/19 0422)  Labs:  Alvira Philips 12/28/10 0545 12/27/10 0529 12/26/10 0705  HGB 9.4* 9.6* --  HCT 28.5* 29.6* 29.9*  PLT 237 235 225  APTT -- -- --  LABPROT 19.1* 20.7* 22.1*  INR 1.57* 1.74* 1.90*  HEPARINUNFRC -- -- --  CREATININE 0.96 0.98 0.94  CKTOTAL -- -- --  CKMB -- -- --  TROPONINI -- -- --   Estimated Creatinine Clearance: 27.7 ml/min (by C-G formula based on Cr of 0.96).   Assessment: Patient is a 77 y.o F on Coumadin for Afib.  INR continue to decrease despite dose increased to 4 mg last night.  Goal of Therapy:  INR 2-3   Plan:  1) Repeat Coumadin 4 mg PO x1 today  Weltha Cathy P 12/28/2010,10:08 AM

## 2010-12-28 NOTE — Progress Notes (Addendum)
   CARE MANAGEMENT NOTE HEART FAILURE  12/28/2010   Patient:  [name], [name]   Account Number:  [id number]    Date Initiated:  12/26/2010  Documentation initiated by:  Shannan Harper  Subjective/Objective Assessment:   Patient admitted with increased SOB, questionable COPD vs CHF, questionable pna.   Action/Plan:   Discharge back to home with possible home health services for CHF management.  Has a personal caretaker for assistance 3 days per week with daughter who comes each and every weekend.   Anticipated DC Date:    Anticipated DC Plan:    DC Planning Services:  CM consult    Choice offered to / List presented to:          Status of service:  In process, will continue to follow  Medicare Important Message Given:   (If response is "NO", the following Medicare IM given date fields will be blank) Date Medicare IM Given:   Date Additional Medicare IM Given:    Discharge Disposition:    Per UR Regulation:  Reviewed for med. necessity/level of care/duration of stay  Comments:   UR Completed. 12/26/10 1602 Burnard Bunting, BSN  Spoke with nursing staff today in regards to the home health CHF screening.  Per PT/OT notes patient is only appropriate to be discharged to SNF.  Left message with SW, Genelle Bal in regards to following up with assigned patient, as presently no SW has seen the patient.  Will continue to follow. 12/28/10 1636 Shannan Harper, RN, BSN   Initial CM contact:     By:   Initial CSW contact:     By:      Is this an INP Readmission < 30 days:  N (If "YES" please see readmission information at the bottom of note)  Patient living status prior to this admission:  FAMILY  Patient setting prior to this admission:  HOME  Comorbid conditions being treated that contributed to this admission:  HIGH--Afib, CHF  CHF Readmission Risk:  high  Type of patient education provided  HF Patient Education Assessment / Teach Back  HF Zone Tool / Magnet    Weigh daily  Limit salt intake     Patient education provided by  [facility]    Was referral made to [facility]:    Is the patient's PCP the same as attending:   PCP:    Readmission < 30 Days If pt has HH, did they contact the agency before going to the ED:   Name of [facility] agency:    Was the follow-up physician visit scheduled prior to discharge:    Did the patient follow-up with the physician prior to this readmission:    Was there HF Clinic visits prior to readmission:    Were there ED visits between admissions:    Readmit type:    If unscheduled and related indicate reason for readmit:

## 2010-12-28 NOTE — Progress Notes (Signed)
Physical Therapy Treatment Patient Details Name: [name] MRN: [id number] DOB: 01/28/1911 Today's Date: 12/28/2010  PT Assessment/Plan  PT - Assessment/Plan Comments on Treatment Session: pt improving slowly in her ability to assist with mobility.  pt will likely need rehab at SNF if she is to return to her preadmission level. PT Plan: Discharge plan remains appropriate Follow Up Recommendations: Skilled nursing facility Equipment Recommended: Defer to next venue PT Goals  Acute Rehab PT Goals PT Goal: Supine/Side to Sit - Progress: Progressing toward goal PT Goal: Sit to Stand - Progress: Met PT Goal: Stand to Sit - Progress: Progressing toward goal PT Transfer Goal: Bed to Chair/Chair to Bed - Progress: Progressing toward goal PT Goal: Ambulate - Progress: Progressing toward goal  PT Treatment Precautions/Restrictions  Precautions Precautions: Fall Restrictions Weight Bearing Restrictions: No Mobility (including Balance) Transfers Sit to Stand: 3: Mod assist;With upper extremity assist;From chair/3-in-1 Sit to Stand Details (indicate cue type and reason): vc for hand placement; manual A for forward w/shift Stand to Sit: 4: Min assist;With upper extremity assist;To chair/3-in-1 Stand to Sit Details: vc's for hand placement Ambulation/Gait Ambulation/Gait Assistance: 4: Min assist;Other (comment) (to mod (with fatigue)) Ambulation/Gait Assistance Details (indicate cue type and reason): vc's for sequencing, direction, use of RW, manual A for stability Ambulation Distance (Feet): 14 Feet (times 4 including turns and backing up to chair/bed) Assistive device: Rolling walker Gait Pattern: Step-to pattern;Step-through pattern;Decreased step length - left;Decreased stance time - left;Trunk flexed (progressively more shuffle with fatigue)  Posture/Postural Control Posture/Postural Control: No significant limitations Static Sitting Balance Static Sitting - Balance  Support: Bilateral upper extremity supported;Feet supported Static Sitting - Level of Assistance: 5: Stand by assistance Static Sitting - Comment/# of Minutes: 5 Exercise    End of Session PT - End of Session Activity Tolerance: Patient limited by fatigue Patient left: in chair;with call bell in reach;with family/visitor present Nurse Communication: Mobility status for ambulation;Mobility status for transfers General Behavior During Session: [facility] for tasks performed  Rashun Grattan, Eliseo Gum 12/28/2010, 4:38 PM  12/28/2010  Parker Bing, PT [phone] (pager)

## 2010-12-28 NOTE — Progress Notes (Signed)
Subjective: Her breathing is improved although she is ambulating very little and requires moderate assistance to change from a sitting position to a standing position. She needs a tray set up, but requires assistance with bathing and dressing. She is somewhat reluctant to go to a SNF, but she lives alone and is too weak to do her ADLs.  Objective: Vital signs in last 24 hours: Temp:  [98.1 F (36.7 C)-99.2 F (37.3 C)] 98.1 F (36.7 C) (12/19 1305) Pulse Rate:  [75-79] 78  (12/19 1305) Resp:  [14-17] 17  (12/19 1305) BP: (103-137)/(62-65) 137/62 mmHg (12/19 1305) SpO2:  [93 %-98 %] 98 % (12/19 1305) Weight change:    Intake/Output from previous day: 12/18 0701 - 12/19 0700 In: 480 [P.O.:480] Out: 1950 [Urine:1950]   General appearance: alert and cooperative Neck: no adenopathy, no carotid bruit, no JVD, supple, symmetrical, trachea midline and thyroid not enlarged, symmetric, no tenderness/mass/nodules Resp: clear to auscultation bilaterally Cardio: irregularly irregular rhythm Extremities: extremities normal, atraumatic, no cyanosis or edema  Lab Results:  Basename 12/28/10 0545 12/27/10 0529  WBC 8.0 9.5  HGB 9.4* 9.6*  HCT 28.5* 29.6*  PLT 237 235   BMET  Basename 12/28/10 0545 12/27/10 0529  NA 138 136  K 3.7 3.7  CL 100 98  CO2 30 28  GLUCOSE 100* 100*  BUN 21 20  CREATININE 0.96 0.98  CALCIUM 9.1 8.7   CMET CMP     Component Value Date/Time   NA 138 12/28/2010 0545   K 3.7 12/28/2010 0545   CL 100 12/28/2010 0545   CO2 30 12/28/2010 0545   GLUCOSE 100* 12/28/2010 0545   BUN 21 12/28/2010 0545   CREATININE 0.96 12/28/2010 0545   CALCIUM 9.1 12/28/2010 0545   PROT 6.1 12/28/2010 0545   ALBUMIN 2.7* 12/28/2010 0545   AST 12 12/28/2010 0545   ALT 7 12/28/2010 0545   ALKPHOS 60 12/28/2010 0545   BILITOT 0.7 12/28/2010 0545   GFRNONAA 48* 12/28/2010 0545   GFRAA 56* 12/28/2010 0545    CBG (last 3)   Basename 12/26/10 0516  GLUCAP 100*    INR  RESULTS:   Lab Results  Component Value Date   INR 1.57* 12/28/2010   INR 1.74* 12/27/2010   INR 1.90* 12/26/2010     Studies/Results: No results found.  Medications: I have reviewed the patient's current medications.  Assessment/Plan: #1 Anemia:  Improved after transfusion and stable.  #2 CHF:  Improved on current  Meds. #3 Gait Instability and Physical Deconditioning:  Moderately severe, and we will plan on having her transfer to a SNF within the next 24-48 hours.  LOS: 5 days   Kinberly Perris G 12/28/2010, 7:09 PM

## 2010-12-28 NOTE — Progress Notes (Signed)
*   Echocardiogram 2D Echocardiogram has been performed.  Quince Santana, Real [place] 12/28/2010, 12:11 PM

## 2010-12-29 LAB — COMPREHENSIVE METABOLIC PANEL
BUN: 21 mg/dL (ref 6–23)
CO2: 29 mEq/L (ref 19–32)
Chloride: 97 mEq/L (ref 96–112)
Creatinine, Ser: 0.94 mg/dL (ref 0.50–1.10)
GFR calc Af Amer: 57 mL/min — ABNORMAL LOW (ref >90)
GFR calc non Af Amer: 49 mL/min — ABNORMAL LOW (ref >90)
Total Bilirubin: 0.6 mg/dL (ref 0.3–1.2)

## 2010-12-29 LAB — PROTIME-INR: Prothrombin Time: 20.3 seconds — ABNORMAL HIGH (ref 11.6–15.2)

## 2010-12-29 LAB — CBC
Platelets: 273 10*3/uL (ref 150–400)
RBC: 3.23 MIL/uL — ABNORMAL LOW (ref 3.87–5.11)
WBC: 7.1 10*3/uL (ref 4.0–10.5)

## 2010-12-29 MED ORDER — WARFARIN SODIUM 4 MG PO TABS
4.0000 mg | ORAL_TABLET | Freq: Once | ORAL | Status: AC
  Administered 2010-12-29: 4 mg via ORAL
  Filled 2010-12-29: qty 1

## 2010-12-29 NOTE — Progress Notes (Signed)
ANTICOAGULATION CONSULT NOTE - Follow Up Consult  Pharmacy Consult for Coumadin Indication: atrial fibrillation  Allergies  Allergen Reactions  . Cymbalta (Duloxetine Hcl)     Unknown reaction    Patient Measurements: Height: 5\' 3"  (160 cm) Weight: 134 lb 14.7 oz (61.2 kg) IBW/kg (Calculated) : 52.4  Adjusted Body Weight:   Vital Signs: Temp: 98.7 F (37.1 C) (12/20 0430) Temp src: Oral (12/20 0430) BP: 109/65 mmHg (12/20 0430) Pulse Rate: 89  (12/20 0430)  Labs:  Basename 12/29/10 0600 12/28/10 0545 12/27/10 0529  HGB 9.8* 9.4* --  HCT 30.2* 28.5* 29.6*  PLT 273 237 235  APTT -- -- --  LABPROT 20.3* 19.1* 20.7*  INR 1.70* 1.57* 1.74*  HEPARINUNFRC -- -- --  CREATININE 0.94 0.96 0.98  CKTOTAL -- -- --  CKMB -- -- --  TROPONINI -- -- --   Estimated Creatinine Clearance: 28.3 ml/min (by C-G formula based on Cr of 0.94).   Assessment: Patient is a 77 y.o F on Coumadin for Afib.  INR still subtherapeutic but now trending toward goal. Continue home regimen.   Goal of Therapy:  INR 2-3   Plan:  1) Repeat Coumadin 4 mg PO x1 today  Joshuah Minella, [facility] 12/29/2010,9:23 AM

## 2010-12-29 NOTE — Progress Notes (Signed)
Subjective: Her breathing is better, and her appetite is improved. Unfortunately, she remains weak and has difficulty walking. She and her family agree that shortterm SNF rehab is the best choice for her.  Objective: Vital signs in last 24 hours: Temp:  [98.6 F (37 C)-99 F (37.2 C)] 98.6 F (37 C) (12/20 1327) Pulse Rate:  [73-89] 78  (12/20 1327) Resp:  [18-20] 18  (12/20 1327) BP: (108-111)/(63-65) 111/65 mmHg (12/20 1327) SpO2:  [91 %-98 %] 98 % (12/20 1327) Weight:  [61.2 kg (134 lb 14.7 oz)] 134 lb 14.7 oz (61.2 kg) (12/20 0430) Weight change:    Intake/Output from previous day: 12/19 0701 - 12/20 0700 In: 603 [P.O.:600; I.V.:3] Out: 2530 [Urine:2525; Stool:5]   General appearance: alert, cooperative and no distress Resp: clear to auscultation bilaterally Cardio: regular rate and rhythm, S1, S2 normal, no murmur, click, rub or gallop GI: soft, non-tender; bowel sounds normal; no masses,  no organomegaly Extremities: extremities normal, atraumatic, no cyanosis or edema  Lab Results:  Basename 12/29/10 0600 12/28/10 0545  WBC 7.1 8.0  HGB 9.8* 9.4*  HCT 30.2* 28.5*  PLT 273 237   BMET  Basename 12/29/10 0600 12/28/10 0545  NA 136 138  K 3.7 3.7  CL 97 100  CO2 29 30  GLUCOSE 104* 100*  BUN 21 21  CREATININE 0.94 0.96  CALCIUM 9.2 9.1   CMET CMP     Component Value Date/Time   NA 136 12/29/2010 0600   K 3.7 12/29/2010 0600   CL 97 12/29/2010 0600   CO2 29 12/29/2010 0600   GLUCOSE 104* 12/29/2010 0600   BUN 21 12/29/2010 0600   CREATININE 0.94 12/29/2010 0600   CALCIUM 9.2 12/29/2010 0600   PROT 6.6 12/29/2010 0600   ALBUMIN 2.7* 12/29/2010 0600   AST 17 12/29/2010 0600   ALT 8 12/29/2010 0600   ALKPHOS 66 12/29/2010 0600   BILITOT 0.6 12/29/2010 0600   GFRNONAA 49* 12/29/2010 0600   GFRAA 57* 12/29/2010 0600    CBG (last 3)  No results found for this basename: GLUCAP:3 in the last 72 hours  INR RESULTS:   Lab Results  Component Value  Date   INR 1.70* 12/29/2010   INR 1.57* 12/28/2010   INR 1.74* 12/27/2010     Studies/Results: No results found.  Medications: I have reviewed the patient's current medications.  Assessment/Plan: #1 Anemia:  Stable following transfusion. #2 Physical Deconditioning: severe, and we will plan on transferring her to a SNF tomorrow. #3 Atrial Fibrillation:  Stable and appears to be in a sinus rhythm with a good ventricular rate and stroke prophylaxis via coumadin.  LOS: 6 days   Dafney Farler G 12/29/2010, 8:51 PM

## 2010-12-29 NOTE — Progress Notes (Signed)
LATE ENTRY: 12/28/10-Clinical Social Worker completed the psychosocial assessment, which can be found in the shadow chart. FL-2 completed and placed in shadow chart for MD signature and skilled nursing facility search initiated.  CSW will follow-up with patient with bed offers.  Genelle Bal, MSW, LCSW [phone]

## 2010-12-29 NOTE — Progress Notes (Signed)
Occupational Therapy Treatment Patient Details Name: ADRA SHEPLER MRN: [id number] DOB: 1911/10/04 Today's Date: 12/29/2010  OT Assessment/Plan OT Assessment/Plan Comments on Treatment Session: Pt. with improved activity tolerance today and educated on D/C plan for increased rehab prior to D/C home. OT Plan: Discharge plan remains appropriate OT Frequency: Min 2X/week Follow Up Recommendations: Skilled nursing facility;24 hour supervision/assistance Equipment Recommended: Defer to next venue OT Goals Acute Rehab OT Goals OT Goal Formulation: With patient Time For Goal Achievement: 2 weeks ADL Goals Pt Will Perform Grooming: with set-up;with supervision;Standing at sink ADL Goal: Grooming - Progress: Progressing toward goals Pt Will Perform Lower Body Bathing: Sit to stand from bed;with min assist ADL Goal: Lower Body Bathing - Progress: Not addressed Pt Will Transfer to Toilet: with supervision;Regular height toilet;Ambulation ADL Goal: Toilet Transfer - Progress: Progressing toward goals Pt Will Perform Toileting - Hygiene: with supervision;with set-up;Sit to stand from 3-in-1/toilet ADL Goal: Toileting - Hygiene - Progress: Progressing toward goals  OT Treatment Precautions/Restrictions  Precautions Precautions: Fall Restrictions Weight Bearing Restrictions: No   ADL ADL Grooming: Performed;Brushing hair;Set up;Minimal assistance Grooming Details (indicate cue type and reason): Min assist for balance Where Assessed - Grooming: Standing at sink Upper Body Dressing: Performed;Minimal assistance Upper Body Dressing Details (indicate cue type and reason): With donning gown Where Assessed - Upper Body Dressing: Sitting, chair Toilet Transfer: Performed;Moderate assistance Toilet Transfer Details (indicate cue type and reason): With mod verbal cues for hand placement and technique Toilet Transfer Method: Stand pivot Toilet Transfer Equipment: Bedside commode Toileting  - Clothing Manipulation: Performed;Minimal assistance Toileting - Clothing Manipulation Details (indicate cue type and reason): With moving gown Where Assessed - Toileting Clothing Manipulation: Standing Toileting - Hygiene: Performed;Moderate assistance Toileting - Hygiene Details (indicate cue type and reason): Pt. unable to thoroughly reach backside for hygiene Where Assessed - Toileting Hygiene: Standing ADL Comments: Pt. completed dynamic standing balance activities at the sink requiring bilateral UE reaching and crossing midline to reach for objects with one ue supported on countertop. Pt provided with 2 breaks between each 5reps due to SOB and decreased activity tolerance. Pt. educated on D/C plan of increased rehab prior to D/C home. Mobility  Bed Mobility Bed Mobility: No Transfers Transfers: Yes Sit to Stand: 3: Mod assist;With upper extremity assist;From chair/3-in-1 Sit to Stand Details (indicate cue type and reason): mod verbal cues for hand placement Stand to Sit: 4: Min assist;With upper extremity assist;To chair/3-in-1  End of Session OT - End of Session Equipment Utilized During Treatment: Gait belt Activity Tolerance: Patient tolerated treatment well;Patient limited by fatigue Patient left: in chair;with call bell in reach;with family/visitor present Nurse Communication: Mobility status for transfers General Behavior During Session: [facility] for tasks performed Cognition: [facility] for tasks performed  Ronnae Kaser, OTR/L Pager [phone]  12/29/2010, 10:47 AM

## 2010-12-30 DIAGNOSIS — J189 Pneumonia, unspecified organism: Secondary | ICD-10-CM | POA: Diagnosis present

## 2010-12-30 LAB — CULTURE, BLOOD (ROUTINE X 2)
Culture  Setup Time: 201212150239
Culture: NO GROWTH

## 2010-12-30 LAB — CBC
HCT: 31 % — ABNORMAL LOW (ref 36.0–46.0)
MCV: 94.2 fL (ref 78.0–100.0)
RDW: 16.4 % — ABNORMAL HIGH (ref 11.5–15.5)
WBC: 7.7 10*3/uL (ref 4.0–10.5)

## 2010-12-30 LAB — PROTIME-INR
INR: 1.71 — ABNORMAL HIGH (ref 0.00–1.49)
Prothrombin Time: 20.4 seconds — ABNORMAL HIGH (ref 11.6–15.2)

## 2010-12-30 LAB — COMPREHENSIVE METABOLIC PANEL
ALT: 13 U/L (ref 0–35)
Albumin: 2.6 g/dL — ABNORMAL LOW (ref 3.5–5.2)
Alkaline Phosphatase: 73 U/L (ref 39–117)
BUN: 22 mg/dL (ref 6–23)
Potassium: 3.6 mEq/L (ref 3.5–5.1)
Sodium: 135 mEq/L (ref 135–145)
Total Protein: 6.5 g/dL (ref 6.0–8.3)

## 2010-12-30 MED ORDER — OMEGA-3-ACID ETHYL ESTERS 1 G PO CAPS: 1.0000 g | ORAL_CAPSULE | Freq: Every evening | ORAL | Status: DC

## 2010-12-30 MED ORDER — VITAMIN D (CHOLECALCIFEROL) 10 MCG (400 UNIT) PO CHEW: 400.0000 [IU] | CHEWABLE_TABLET | Freq: Two times a day (BID) | ORAL | Status: AC

## 2010-12-30 MED ORDER — COQ10 100 MG PO CAPS: 100.0000 mg/d | ORAL_CAPSULE | ORAL | Status: DC

## 2010-12-30 MED ORDER — FUROSEMIDE 40 MG PO TABS: 40.0000 mg | ORAL_TABLET | Freq: Every day | ORAL | Status: DC

## 2010-12-30 MED ORDER — OMEGA-3-ACID ETHYL ESTERS 1 G PO CAPS
1.0000 g | ORAL_CAPSULE | Freq: Every evening | ORAL | Status: DC
  Administered 2010-12-30: 1 g via ORAL

## 2010-12-30 MED ORDER — POTASSIUM CHLORIDE ER 8 MEQ PO TBCR: 8.0000 meq | EXTENDED_RELEASE_TABLET | Freq: Two times a day (BID) | ORAL | Status: DC

## 2010-12-30 NOTE — Progress Notes (Signed)
Spoke with MD oncall. MD concerned about potassium levels, will result in this mornings labs already ordered and to monoitor patient. Dr. Jarold Motto will be around this morning and will address.  Will continue to monitor

## 2010-12-30 NOTE — Progress Notes (Signed)
[name] is being discharged to [facility] today. Discharge information forwarded to facility. Patient will be transported to facility by ambulance. Daughter [name] completed admissions paperwork at facility.  Genelle Bal, MSW, LCSW [phone]

## 2010-12-30 NOTE — Discharge Summary (Signed)
Physician Discharge Summary  Patient ID: VERDEAN MURIN MRN: [id number] DOB/AGE: 1911-04-17 77 y.o.  Admit date: 12/23/2010 Discharge date: 12/30/2010   Discharge Diagnoses:  Principal Problem:  *Dyspnea Active Problems:  Stroke/cerebrovascular accident  Anemia  CHF exacerbation  Pneumonia  Atrial fibrillation  Gait instability  Fatigue  Physical deconditioning  Protein calorie malnutrition  Chronic anticoagulation   Discharged Condition: good  Hospital Course: The patient is a 77 year old woman with multiple medical problems, most notably paroxysmal atrial fibrillation, for which he is on chronic Coumadin treatment after 8 2012 embolic stroke because right upper extremity weakness and some expressive ectasia. She also has advanced osteoarthritis of the lumbar spine and bilateral knees. Over the 2-3 days prior to hospitalization she had increasing symptoms of dry cough, wheezing, shortness of breath, and pervasive weakness. She did not have fever, chills, or substernal chest pain. She lives by herself and has one hour of caretaker assistance 3 days per week, along with the care provided by her daughter on the weekends. There is some question as to whether or not she has been taking her medications as prescribed. After her stroke in January 2012 she was transferred to a skilled nursing facility for rehabilitation. Her family realizes that she would ideally live in an assisted living setting because of her multiple medical problems, however she does not have the financial resources today for this. She was admitted with worsening dyspnea on exertion and profound weakness.  Her admission laboratory studies were significant for moderately severe anemia and an elevated BNP level with no evidence of leukocytosis. She was treated with medications for congestive heart failure as well as Rocephin and Zithromax for possible pneumonia. In addition she was given a transfusion of 2 units of  packed red blood cells. Her condition improved slowly, but she remains with pervasive weakness and difficulty ambulating to a chair. She also had an echocardiogram done during her stay that showed a left ventricular ejection fraction of 50-55% with aortic sclerosis without significant stenosis, mild mitral regurgitation with severe left atrial enlargement, moderate right ventricular enlargement, and right atrial enlargement with moderate to severe tricuspid regurgitation. She is seen by physical therapy and occupational therapy personnel who recommended short-term skilled nursing facility rehabilitation of her physical deconditioning and gait instability.  Consults: none  Significant Diagnostic Studies:  No results found.  Labs: Lab Results  Component Value Date   WBC 7.7 12/30/2010   HGB 9.8* 12/30/2010   HCT 31.0* 12/30/2010   MCV 94.2 12/30/2010   PLT 296 12/30/2010     Lab 12/30/10 0505  NA 135  K 3.6  CL 97  CO2 29  BUN 22  CREATININE 0.91  CALCIUM 9.5  PROT 6.5  BILITOT 0.4  ALKPHOS 73  ALT 13  AST 26  GLUCOSE 100*       Lab Results  Component Value Date   INR 1.71* 12/30/2010   INR 1.70* 12/29/2010   INR 1.57* 12/28/2010     Recent Results (from the past 240 hour(s))  CULTURE, BLOOD (ROUTINE X 2)     Status: Normal (Preliminary result)   Collection Time   12/23/10  9:14 PM      Component Value Range Status Comment   Specimen Description BLOOD RIGHT ARM   Final    Special Requests BOTTLES DRAWN AEROBIC AND ANAEROBIC 10CC   Final    Setup Time [id number]   Final    Culture     Final    Value:  BLOOD CULTURE RECEIVED NO GROWTH TO DATE CULTURE WILL BE HELD FOR 5 DAYS BEFORE ISSUING A FINAL NEGATIVE REPORT   Report Status PENDING   Incomplete   CULTURE, BLOOD (ROUTINE X 2)     Status: Normal (Preliminary result)   Collection Time   12/23/10  9:20 PM      Component Value Range Status Comment   Specimen Description BLOOD RIGHT ARM   Final    Special  Requests BOTTLES DRAWN AEROBIC AND ANAEROBIC 10CC   Final    Setup Time [id number]   Final    Culture     Final    Value:        BLOOD CULTURE RECEIVED NO GROWTH TO DATE CULTURE WILL BE HELD FOR 5 DAYS BEFORE ISSUING A FINAL NEGATIVE REPORT   Report Status PENDING   Incomplete       Discharge Exam: Blood pressure 125/66, pulse 82, temperature 98.4 F (36.9 C), temperature source Oral, resp. rate 18, height 5\' 3"  (1.6 m), weight 61.2 kg (134 lb 14.7 oz), SpO2 92.00%.  Physical Exam: In general, she is a frail elderly white woman who was in no apparent distress while lying at 10 elevation head of bed. HEENT exam was within normal limits, neck was supple there was mild jugular venous distention. Chest had minimal bibasilar crackles, heart had a regular rate and rhythm with a systolic ejection murmur grade 2/6 at the left sternal border, abdomen had normal bowel sounds and no tenderness, extremities were without edema and there is significant Oster arthritis changes of both knees. Neurologic exam: She was alert and could give a good history. She had very mild expressive aphasia and 4/5 right upper extremity strength. She did move other extremities well.  Disposition: She'll be discharged from the hospital to a skilled nursing facility for rehabilitation of her physical deconditioning and gait instability. To be seen by the attending physician at the skilled nursing facility. When she completes rehabilitation at the skilled nursing facility she can have a followup visit with Dr. Jarome Matin at [facility] by calling [phone] for an appointment. In  Discharge Orders    Future Orders Please Complete By Expires   Diet - low sodium heart healthy      Increase activity slowly      Discharge instructions      Comments:   She will need PT and OT rehab, also needs to have her body weight checked once weekly and INR checked once weekly   (HEART FAILURE PATIENTS) Call MD:  Anytime  you have any of the following symptoms: 1) 3 pound weight gain in 24 hours or 5 pounds in 1 week 2) shortness of breath, with or without a dry hacking cough 3) swelling in the hands, feet or stomach 4) if you have to sleep on extra pillows at night in order to breathe.        Current Discharge Medication List    START taking these medications   Details  furosemide (LASIX) 40 MG tablet Take 1 tablet (40 mg total) by mouth daily. Qty: 30 tablet, Refills: 12    potassium chloride (KLOR-CON) 8 MEQ tablet Take 1 tablet (8 mEq total) by mouth 2 (two) times daily. Qty: 60 tablet, Refills: 2      CONTINUE these medications which have CHANGED   Details  Coenzyme Q10 (COQ10) 100 MG CAPS Take 100 mg/day by mouth 1 day or 1 dose. Qty: 30 each, Refills: 12    Vitamin  D, Cholecalciferol, 400 UNITS CHEW Chew 1 tablet (400 Units total) by mouth 2 (two) times daily. Qty: 60 tablet, Refills: 3      CONTINUE these medications which have NOT CHANGED   Details  ALPRAZolam (XANAX) 0.25 MG tablet Take 0.125-0.25 mg by mouth every 6 (six) hours as needed. For anxiety    calcium-vitamin D (OSCAL WITH D) 500-200 MG-UNIT per tablet Take 1 tablet by mouth 2 (two) times daily.      feeding supplement (ENSURE IMMUNE HEALTH) LIQD Take 237 mLs by mouth every morning.      fish oil-omega-3 fatty acids 1000 MG capsule Take 1 g by mouth every evening.     gabapentin (NEURONTIN) 300 MG capsule Take 300-600 mg by mouth 3 (three) times daily. 600mg  every morning; 300mg  at noon; 600mg  in the evening    HYDROcodone-acetaminophen (NORCO) 5-325 MG per tablet Take 1-2 tablets by mouth every 8 (eight) hours as needed. For mild to moderate pain     levothyroxine (SYNTHROID, LEVOTHROID) 75 MCG tablet Take 75 mcg by mouth daily.      nitroGLYCERIN (NITROSTAT) 0.4 MG SL tablet Place 0.4 mg under the tongue every 5 (five) minutes as needed. For chest pain     pantoprazole (PROTONIX) 40 MG tablet Take 40 mg by mouth every  morning.     polyethylene glycol (MIRALAX / GLYCOLAX) packet Take 17 g by mouth daily as needed. For constipation    polysaccharide iron (NIFEREX) 150 MG CAPS capsule Take 150 mg by mouth every other day.     simvastatin (ZOCOR) 10 MG tablet Take 5 mg by mouth at bedtime.      tiotropium (SPIRIVA) 18 MCG inhalation capsule Place 18 mcg into inhaler and inhale daily.     warfarin (COUMADIN) 2 MG tablet Take 2-4 mg by mouth every evening. 2mg  Monday, Wednesday, Friday; 4mg  Tuesday, Thursday, Saturday, Sunday         Signed: Garlan Fillers 12/30/2010, 8:23 AM

## 2010-12-30 NOTE — Progress Notes (Signed)
Physical Therapy Treatment Patient Details Name: [name] MRN: [id number] DOB: 09-Nov-1911 Today's Date: 12/30/2010  PT Assessment/Plan  PT - Assessment/Plan PT Plan: Discharge plan remains appropriate Follow Up Recommendations: Skilled nursing facility Equipment Recommended: Defer to next venue PT Goals  Acute Rehab PT Goals PT Goal: Supine/Side to Sit - Progress: Progressing toward goal PT Goal: Sit to Stand - Progress: Progressing toward goal PT Goal: Stand to Sit - Progress: Progressing toward goal PT Transfer Goal: Bed to Chair/Chair to Bed - Progress: Progressing toward goal PT Goal: Ambulate - Progress: Other (comment) (very slowed progress)  PT Treatment Precautions/Restrictions  Precautions Precautions: Fall Restrictions Weight Bearing Restrictions: No Mobility (including Balance) Bed Mobility Bed Mobility: No Transfers Transfers: Yes Sit to Stand: 3: Mod assist;With upper extremity assist;Patient percentage (comment);From chair/3-in-1 (Pt<75%, sit to stand from toilet with grab bar and min grard) Ambulation/Gait Ambulation/Gait: Yes Ambulation/Gait Assistance: 4: Min assist Ambulation/Gait Assistance Details (indicate cue type and reason): step to pattern mostly because her R knee hurts Ambulation Distance (Feet): 15 Feet (times 2) Assistive device: Rolling walker Gait Pattern: Step-to pattern;Decreased step length - left;Decreased stride length;Decreased weight shift to right;Trunk flexed  Posture/Postural Control Posture/Postural Control: No significant limitations Postural Limitations: kyphotic posture Static Sitting Balance Static Sitting - Balance Support: Bilateral upper extremity supported;Feet supported Static Sitting - Level of Assistance: 5: Stand by assistance Static Sitting - Comment/# of Minutes: 5 Exercise    End of Session PT - End of Session Activity Tolerance: Patient limited by fatigue;Patient limited by pain Patient left: in  chair;with call bell in reach Nurse Communication: Mobility status for transfers;Mobility status for ambulation General Behavior During Session:  (stressed and frustrated about discharge) Cognition: [facility] for tasks performed  Estela Vinal, Eliseo Gum 12/30/2010, 1:23 PM  12/30/2010  Ludlow Bing, PT [phone] (pager)

## 2010-12-30 NOTE — Plan of Care (Signed)
Problem: Phase II Progression Outcomes Goal: Discharge plan established Outcome: Completed/Met Date Met:  12/30/10 Pt to discharge to [facility]

## 2010-12-30 NOTE — Progress Notes (Signed)
At 0712 pt had 17 beats of Vtach. Pt was asleep and denies any symptoms and no signs of distress. Pt vitals 106/59, 83 afib, 94%Ra. Paged MD oncall. Told pt symptoms to call for. Will continue to monitor

## 2011-06-09 ENCOUNTER — Encounter (HOSPITAL_COMMUNITY): Payer: Self-pay | Admitting: Emergency Medicine

## 2011-06-09 ENCOUNTER — Emergency Department (HOSPITAL_COMMUNITY): Payer: Medicare Other

## 2011-06-09 ENCOUNTER — Emergency Department (HOSPITAL_COMMUNITY)
Admission: EM | Admit: 2011-06-09 | Discharge: 2011-06-10 | Disposition: A | Discharge status: Home / Self Care | Payer: Medicare Other | Attending: Emergency Medicine | Admitting: Emergency Medicine

## 2011-06-09 DIAGNOSIS — F411 Generalized anxiety disorder: Secondary | ICD-10-CM | POA: Insufficient documentation

## 2011-06-09 DIAGNOSIS — F329 Major depressive disorder, single episode, unspecified: Secondary | ICD-10-CM | POA: Insufficient documentation

## 2011-06-09 DIAGNOSIS — R059 Cough, unspecified: Secondary | ICD-10-CM | POA: Insufficient documentation

## 2011-06-09 DIAGNOSIS — R05 Cough: Secondary | ICD-10-CM | POA: Insufficient documentation

## 2011-06-09 DIAGNOSIS — F3289 Other specified depressive episodes: Secondary | ICD-10-CM | POA: Insufficient documentation

## 2011-06-09 DIAGNOSIS — Z7901 Long term (current) use of anticoagulants: Secondary | ICD-10-CM | POA: Insufficient documentation

## 2011-06-09 DIAGNOSIS — J40 Bronchitis, not specified as acute or chronic: Secondary | ICD-10-CM

## 2011-06-09 DIAGNOSIS — I4891 Unspecified atrial fibrillation: Secondary | ICD-10-CM | POA: Insufficient documentation

## 2011-06-09 DIAGNOSIS — G589 Mononeuropathy, unspecified: Secondary | ICD-10-CM | POA: Insufficient documentation

## 2011-06-09 LAB — COMPREHENSIVE METABOLIC PANEL
AST: 20 U/L (ref 0–37)
Albumin: 3.4 g/dL — ABNORMAL LOW (ref 3.5–5.2)
BUN: 16 mg/dL (ref 6–23)
Calcium: 9.4 mg/dL (ref 8.4–10.5)
Chloride: 103 mEq/L (ref 96–112)
Creatinine, Ser: 0.89 mg/dL (ref 0.50–1.10)
Potassium: 4.2 mEq/L (ref 3.5–5.1)
Sodium: 141 mEq/L (ref 135–145)
Total Protein: 6.6 g/dL (ref 6.0–8.3)

## 2011-06-09 LAB — DIFFERENTIAL
Lymphocytes Relative: 37 % (ref 12–46)
Lymphs Abs: 2.1 10*3/uL (ref 0.7–4.0)
Neutrophils Relative %: 51 % (ref 43–77)

## 2011-06-09 LAB — CBC
Hemoglobin: 10.8 g/dL — ABNORMAL LOW (ref 12.0–15.0)
MCV: 93.9 fL (ref 78.0–100.0)
Platelets: 161 10*3/uL (ref 150–400)
RBC: 3.44 MIL/uL — ABNORMAL LOW (ref 3.87–5.11)
WBC: 5.5 10*3/uL (ref 4.0–10.5)

## 2011-06-09 MED ORDER — SODIUM CHLORIDE 0.9 % IV SOLN
Freq: Once | INTRAVENOUS | Status: AC
  Administered 2011-06-09: 23:00:00 via INTRAVENOUS

## 2011-06-09 NOTE — ED Notes (Signed)
GEX:BM84<XL> Expected date:<BR> Expected time:<BR> Means of arrival:<BR> Comments:<BR> EMS 38 PTAR, 89 yof cough, febrile

## 2011-06-09 NOTE — ED Notes (Signed)
Pt c/o cough x3 days. Pt states that cough is "sometimes" productive. Pt denies chest pain, SOB, sore throat, and headache. Pt is warm to touch but not febrile on arrival.

## 2011-06-09 NOTE — ED Notes (Signed)
Per ems: Pt comes from nursing facility with c/o cough and fever x3 days. Cough is non-productive. Pt is oriented to place/self but not time/date. Pt was slightly hypoxic in route and was put on 3L.

## 2011-06-10 LAB — URINALYSIS, ROUTINE W REFLEX MICROSCOPIC
Glucose, UA: NEGATIVE mg/dL
Hgb urine dipstick: NEGATIVE
Leukocytes, UA: NEGATIVE
Protein, ur: NEGATIVE mg/dL
Specific Gravity, Urine: 1.017 (ref 1.005–1.030)
pH: 6.5 (ref 5.0–8.0)

## 2011-06-10 MED ORDER — MOXIFLOXACIN HCL 400 MG PO TABS
400.0000 mg | ORAL_TABLET | Freq: Once | ORAL | Status: AC
  Administered 2011-06-10: 400 mg via ORAL
  Filled 2011-06-10: qty 1

## 2011-06-10 MED ORDER — GUAIFENESIN ER 600 MG PO TB12: 600.0000 mg | ORAL_TABLET | Freq: Two times a day (BID) | ORAL | Status: AC

## 2011-06-10 MED ORDER — AZITHROMYCIN 250 MG PO TABS: 500.0000 mg | ORAL_TABLET | Freq: Once | ORAL | Status: DC

## 2011-06-10 MED ORDER — AZITHROMYCIN 250 MG PO TABS: ORAL_TABLET | ORAL | Status: DC

## 2011-06-10 MED ORDER — MOXIFLOXACIN HCL 400 MG PO TABS: ORAL_TABLET | ORAL | Status: DC

## 2011-06-10 NOTE — ED Provider Notes (Signed)
History     CSN: [id number]  Arrival date & time 06/09/11  2158   First MD Initiated Contact with Patient 06/09/11 2257      Chief Complaint  Patient presents with  . Cough    (Consider location/radiation/quality/duration/timing/severity/associated sxs/prior treatment) HPI  Patient reports she's had a cough for the past couple days. She denies chest pain or shortness of breath. She states she did have rhinorrhea but that is improved. She denies sore throat, nausea, vomiting, or diarrhea. EMS reported "mild hypoxia" on their arrival, there she documents pulse ox was 93% on room air. She denies been on oxygen at her nursing facility. EMS was told she had fever by NH but no documentation was sent to that effect.   PCP Dr. Netty Starring  Past Medical History  Diagnosis Date  . Chronic atrial fibrillation   . History of GI bleed   . Neuropathy   . Fatigue   . Palpitations   . SOB (shortness of breath)   . OA (osteoarthritis)   . Weakness   . Forgetfulness   . Anxiety   . Depression     Past Surgical History  Procedure Date  . Umbilical hernia repair   . Appendectomy   . Knee surgery   . Tonsillectomy   . Vaginal hysterectomy   . US echocardiography 08/07/2002    EF 60-65%    Family History  Problem Relation Age of Onset  . Gallbladder disease Mother   . Pancreatic cancer Father     History  Substance Use Topics  . Smoking status: Never Smoker   . Smokeless tobacco: Not on file  . Alcohol Use: No  lives in nursing home  OB History    Grav Para Term Preterm Abortions TAB SAB Ect Mult Living                  Review of Systems  All other systems reviewed and are negative.    Allergies  Cymbalta  Home Medications   Current Outpatient Rx  Name Route Sig Dispense Refill  . ALPRAZOLAM 0.25 MG PO TABS Oral Take 0.125-0.25 mg by mouth every 6 (six) hours as needed. For anxiety    . CALCIUM CARBONATE-VITAMIN D 500-200 MG-UNIT PO TABS Oral Take 1 tablet  by mouth 2 (two) times daily.      . COQ10 100 MG PO CAPS Oral Take 100 mg/day by mouth 1 day or 1 dose. 30 each 12  . FUROSEMIDE 40 MG PO TABS Oral Take 1 tablet (40 mg total) by mouth daily. 30 tablet 12  . GABAPENTIN 300 MG PO CAPS Oral Take 300-600 mg by mouth See admin instructions. Takes 2 capsules every morning(600mg  dosage) and take 1 capsule at 12noon(300mg  dosage), and take 2 capsules at bedtime (600mg  dosage)    . HYDROCODONE-ACETAMINOPHEN 5-325 MG PO TABS Oral Take 1-2 tablets by mouth every 8 (eight) hours as needed. For mild to moderate pain     . LEVOTHYROXINE SODIUM 75 MCG PO TABS Oral Take 75 mcg by mouth daily.      . OMEGA-3-ACID ETHYL ESTERS 1 G PO CAPS Oral Take 1 capsule (1 g total) by mouth every evening. 30 capsule 12  . POLYETHYLENE GLYCOL 3350 PO PACK Oral Take 17 g by mouth daily as needed. For constipation    . POLYSACCHARIDE IRON 150 MG PO CAPS Oral Take 150 mg by mouth every other day.     Marland Kitchen POTASSIUM CHLORIDE ER 8 MEQ PO TBCR  Oral Take 1 tablet (8 mEq total) by mouth 2 (two) times daily. 60 tablet 2  . VITAMIN D (CHOLECALCIFEROL) 400 UNITS PO CHEW Oral Chew 1 tablet (400 Units total) by mouth 2 (two) times daily. 60 tablet 3  . WARFARIN SODIUM 1 MG PO TABS Oral Take 1 mg by mouth See admin instructions. Takes 1 tablet daily with 2.5mg  dosage that equals 3.5mg  dosage    . WARFARIN SODIUM 2.5 MG PO TABS Oral Take 2.5 mg by mouth See admin instructions. Takes 1 tablet daily with 1mg  dosage to equal 3.5 mg dosage    . TIOTROPIUM BROMIDE MONOHYDRATE 18 MCG IN CAPS Inhalation Place 18 mcg into inhaler and inhale daily.       BP 125/65  Pulse 57  Temp(Src) 98.5 F (36.9 C) (Oral)  Resp 20  SpO2 100%  Vital signs normal except bradycardia patient noted to be in atrial fib on her monitor.,    Physical Exam  Nursing note and vitals reviewed. Constitutional: She is oriented to person, place, and time. She appears well-developed and well-nourished.  Non-toxic  appearance. She does not appear ill. No distress.  HENT:  Head: Normocephalic and atraumatic.  Right Ear: External ear normal.  Left Ear: External ear normal.  Nose: Nose normal. No mucosal edema or rhinorrhea.  Mouth/Throat: Oropharynx is clear and moist and mucous membranes are normal. No dental abscesses or uvula swelling.  Eyes: Conjunctivae and EOM are normal. Pupils are equal, round, and reactive to light.  Neck: Normal range of motion and full passive range of motion without pain. Neck supple.  Cardiovascular: Normal rate, regular rhythm and normal heart sounds.  Exam reveals no gallop and no friction rub.   No murmur heard. Pulmonary/Chest: Effort normal and breath sounds normal. No respiratory distress. She has no wheezes. She has no rhonchi. She has no rales. She exhibits no tenderness and no crepitus.       Rare rhonchi  Abdominal: Soft. Normal appearance and bowel sounds are normal. She exhibits no distension. There is no tenderness. There is no rebound and no guarding.  Musculoskeletal: Normal range of motion. She exhibits no edema and no tenderness.       Moves all extremities well.   Neurological: She is alert and oriented to person, place, and time. She has normal strength. No cranial nerve deficit.  Skin: Skin is warm, dry and intact. No rash noted. No erythema. No pallor.  Psychiatric: She has a normal mood and affect. Her speech is normal and behavior is normal. Her mood appears not anxious.    ED Course  Procedures (including critical care time)   Medications  0.9 %  sodium chloride infusion (  Intravenous New Bag/Given 06/09/11 2323)  moxifloxacin (AVELOX) tablet 400 mg (400 mg Oral Given 06/10/11 0051)    Pulse ox on RA was 96%  Pt states she feels fine and is ready to be discharged.   Results for orders placed during the hospital encounter of 06/09/11  CBC      Component Value Range   WBC 5.5  4.0 - 10.5 (K/uL)   RBC 3.44 (*) 3.87 - 5.11 (MIL/uL)    Hemoglobin 10.8 (*) 12.0 - 15.0 (g/dL)   HCT 16.1 (*) 09.6 - 46.0 (%)   MCV 93.9  78.0 - 100.0 (fL)   MCH 31.4  26.0 - 34.0 (pg)   MCHC 33.4  30.0 - 36.0 (g/dL)   RDW 04.5 (*) 40.9 - 15.5 (%)   Platelets 161  150 - 400 (K/uL)  DIFFERENTIAL      Component Value Range   Neutrophils Relative 51  43 - 77 (%)   Neutro Abs 2.8  1.7 - 7.7 (K/uL)   Lymphocytes Relative 37  12 - 46 (%)   Lymphs Abs 2.1  0.7 - 4.0 (K/uL)   Monocytes Relative 8  3 - 12 (%)   Monocytes Absolute 0.4  0.1 - 1.0 (K/uL)   Eosinophils Relative 4  0 - 5 (%)   Eosinophils Absolute 0.2  0.0 - 0.7 (K/uL)   Basophils Relative 0  0 - 1 (%)   Basophils Absolute 0.0  0.0 - 0.1 (K/uL)  COMPREHENSIVE METABOLIC PANEL      Component Value Range   Sodium 141  135 - 145 (mEq/L)   Potassium 4.2  3.5 - 5.1 (mEq/L)   Chloride 103  96 - 112 (mEq/L)   CO2 28  19 - 32 (mEq/L)   Glucose, Bld 90  70 - 99 (mg/dL)   BUN 16  6 - 23 (mg/dL)   Creatinine, Ser 1.61  0.50 - 1.10 (mg/dL)   Calcium 9.4  8.4 - 09.6 (mg/dL)   Total Protein 6.6  6.0 - 8.3 (g/dL)   Albumin 3.4 (*) 3.5 - 5.2 (g/dL)   AST 20  0 - 37 (U/L)   ALT 12  0 - 35 (U/L)   Alkaline Phosphatase 79  39 - 117 (U/L)   Total Bilirubin 0.4  0.3 - 1.2 (mg/dL)   GFR calc non Af Amer 53 (*) >90 (mL/min)   GFR calc Af Amer 61 (*) >90 (mL/min)  URINALYSIS, ROUTINE W REFLEX MICROSCOPIC      Component Value Range   Color, Urine YELLOW  YELLOW    APPearance CLEAR  CLEAR    Specific Gravity, Urine 1.017  1.005 - 1.030    pH 6.5  5.0 - 8.0    Glucose, UA NEGATIVE  NEGATIVE (mg/dL)   Hgb urine dipstick NEGATIVE  NEGATIVE    Bilirubin Urine NEGATIVE  NEGATIVE    Ketones, ur NEGATIVE  NEGATIVE (mg/dL)   Protein, ur NEGATIVE  NEGATIVE (mg/dL)   Urobilinogen, UA 0.2  0.0 - 1.0 (mg/dL)   Nitrite NEGATIVE  NEGATIVE    Leukocytes, UA NEGATIVE  NEGATIVE   LACTIC ACID, PLASMA      Component Value Range   Lactic Acid, Venous 1.0  0.5 - 2.2 (mmol/L)   Laboratory interpretation all  normal except improving stable anemia   Dg Chest 2 View  06/09/2011  *RADIOLOGY REPORT*  Clinical Data: Cough.  Chest congestion.  Shortness of breath.  CHEST - 2 VIEW  Comparison: 12/24/2010 and chest CT dated 03/24/2004.  Findings: Stable enlarged cardiac silhouette, hyperexpansion of the lungs and diffuse prominence of the interstitial markings.  A moderate-sized hiatal hernia is again demonstrated.  Diffuse osteopenia with mild thoracolumbar spine compression deformities. These have not changed significantly since 06/17/2010.  IMPRESSION:  1.  Stable cardiomegaly and changes of COPD. 2.  Stable moderate sized hiatal hernia.  Original Report Authenticated By: Darrol Angel, M.D.      1. Bronchitis     New Prescriptions   GUAIFENESIN (MUCINEX) 600 MG 12 HR TABLET    Take 1 tablet (600 mg total) by mouth 2 (two) times daily.   MOXIFLOXACIN (AVELOX) 400 MG TABLET    Take 1 po daily starting on Saturday night   Plan discharge  Devoria Albe, MD, Armando Gang   MDM  Ward Givens, MD 06/10/11 [phone]

## 2011-06-10 NOTE — Discharge Instructions (Signed)
Your chest xray tonight doesn't show a pneumonia. Take the antibiotics until gone. Take the mucinex for cough. Recheck if you struggle to breathe, get chest pain, vomiting or feel worse.

## 2011-06-11 LAB — URINE CULTURE
Colony Count: NO GROWTH
Culture: NO GROWTH

## 2011-06-16 LAB — CULTURE, BLOOD (ROUTINE X 2)
Culture  Setup Time: 201306010545
Culture: NO GROWTH
Culture: NO GROWTH

## 2012-11-15 ENCOUNTER — Inpatient Hospital Stay (HOSPITAL_COMMUNITY)
Admission: EM | Admit: 2012-11-15 | Discharge: 2012-11-22 | DRG: 602 | Disposition: A | Discharge status: Skilled Nursing Facility | Payer: Medicare Other | Attending: Internal Medicine | Admitting: Internal Medicine

## 2012-11-15 ENCOUNTER — Encounter (HOSPITAL_COMMUNITY): Payer: Self-pay | Admitting: Emergency Medicine

## 2012-11-15 ENCOUNTER — Emergency Department (HOSPITAL_COMMUNITY): Payer: Medicare Other

## 2012-11-15 ENCOUNTER — Inpatient Hospital Stay (HOSPITAL_COMMUNITY): Payer: Medicare Other

## 2012-11-15 DIAGNOSIS — R5381 Other malaise: Secondary | ICD-10-CM

## 2012-11-15 DIAGNOSIS — J189 Pneumonia, unspecified organism: Secondary | ICD-10-CM | POA: Diagnosis present

## 2012-11-15 DIAGNOSIS — L0390 Cellulitis, unspecified: Secondary | ICD-10-CM | POA: Diagnosis present

## 2012-11-15 DIAGNOSIS — F3289 Other specified depressive episodes: Secondary | ICD-10-CM | POA: Diagnosis present

## 2012-11-15 DIAGNOSIS — I509 Heart failure, unspecified: Secondary | ICD-10-CM | POA: Diagnosis present

## 2012-11-15 DIAGNOSIS — J811 Chronic pulmonary edema: Secondary | ICD-10-CM

## 2012-11-15 DIAGNOSIS — J690 Pneumonitis due to inhalation of food and vomit: Secondary | ICD-10-CM

## 2012-11-15 DIAGNOSIS — F411 Generalized anxiety disorder: Secondary | ICD-10-CM | POA: Diagnosis present

## 2012-11-15 DIAGNOSIS — F329 Major depressive disorder, single episode, unspecified: Secondary | ICD-10-CM | POA: Diagnosis present

## 2012-11-15 DIAGNOSIS — I70209 Unspecified atherosclerosis of native arteries of extremities, unspecified extremity: Secondary | ICD-10-CM

## 2012-11-15 DIAGNOSIS — D509 Iron deficiency anemia, unspecified: Secondary | ICD-10-CM | POA: Diagnosis present

## 2012-11-15 DIAGNOSIS — Z79899 Other long term (current) drug therapy: Secondary | ICD-10-CM

## 2012-11-15 DIAGNOSIS — L03115 Cellulitis of right lower limb: Secondary | ICD-10-CM

## 2012-11-15 DIAGNOSIS — I4891 Unspecified atrial fibrillation: Secondary | ICD-10-CM | POA: Diagnosis present

## 2012-11-15 DIAGNOSIS — I639 Cerebral infarction, unspecified: Secondary | ICD-10-CM

## 2012-11-15 DIAGNOSIS — D638 Anemia in other chronic diseases classified elsewhere: Secondary | ICD-10-CM | POA: Diagnosis present

## 2012-11-15 DIAGNOSIS — L02419 Cutaneous abscess of limb, unspecified: Principal | ICD-10-CM

## 2012-11-15 DIAGNOSIS — J4489 Other specified chronic obstructive pulmonary disease: Secondary | ICD-10-CM | POA: Diagnosis present

## 2012-11-15 DIAGNOSIS — Z66 Do not resuscitate: Secondary | ICD-10-CM | POA: Diagnosis present

## 2012-11-15 DIAGNOSIS — Z7901 Long term (current) use of anticoagulants: Secondary | ICD-10-CM

## 2012-11-15 DIAGNOSIS — I5033 Acute on chronic diastolic (congestive) heart failure: Secondary | ICD-10-CM

## 2012-11-15 DIAGNOSIS — R0600 Dyspnea, unspecified: Secondary | ICD-10-CM

## 2012-11-15 DIAGNOSIS — R2681 Unsteadiness on feet: Secondary | ICD-10-CM

## 2012-11-15 DIAGNOSIS — E039 Hypothyroidism, unspecified: Secondary | ICD-10-CM | POA: Diagnosis present

## 2012-11-15 DIAGNOSIS — I69922 Dysarthria following unspecified cerebrovascular disease: Secondary | ICD-10-CM

## 2012-11-15 DIAGNOSIS — I69991 Dysphagia following unspecified cerebrovascular disease: Secondary | ICD-10-CM

## 2012-11-15 DIAGNOSIS — K219 Gastro-esophageal reflux disease without esophagitis: Secondary | ICD-10-CM | POA: Diagnosis present

## 2012-11-15 DIAGNOSIS — D696 Thrombocytopenia, unspecified: Secondary | ICD-10-CM | POA: Diagnosis present

## 2012-11-15 DIAGNOSIS — J449 Chronic obstructive pulmonary disease, unspecified: Secondary | ICD-10-CM | POA: Diagnosis present

## 2012-11-15 DIAGNOSIS — R1310 Dysphagia, unspecified: Secondary | ICD-10-CM

## 2012-11-15 DIAGNOSIS — R269 Unspecified abnormalities of gait and mobility: Secondary | ICD-10-CM | POA: Diagnosis present

## 2012-11-15 DIAGNOSIS — R5383 Other fatigue: Secondary | ICD-10-CM

## 2012-11-15 DIAGNOSIS — D649 Anemia, unspecified: Secondary | ICD-10-CM | POA: Diagnosis present

## 2012-11-15 DIAGNOSIS — I69998 Other sequelae following unspecified cerebrovascular disease: Secondary | ICD-10-CM

## 2012-11-15 LAB — CBC WITH DIFFERENTIAL/PLATELET
Basophils Relative: 0 % (ref 0–1)
Eosinophils Absolute: 0 10*3/uL (ref 0.0–0.7)
Eosinophils Relative: 0 % (ref 0–5)
Hemoglobin: 9.9 g/dL — ABNORMAL LOW (ref 12.0–15.0)
Lymphs Abs: 1 10*3/uL (ref 0.7–4.0)
MCH: 32.5 pg (ref 26.0–34.0)
MCHC: 33.6 g/dL (ref 30.0–36.0)
MCV: 96.7 fL (ref 78.0–100.0)
Monocytes Relative: 11 % (ref 3–12)
Neutrophils Relative %: 78 % — ABNORMAL HIGH (ref 43–77)
Platelets: 150 10*3/uL (ref 150–400)
RBC: 3.05 MIL/uL — ABNORMAL LOW (ref 3.87–5.11)

## 2012-11-15 LAB — BASIC METABOLIC PANEL
BUN: 24 mg/dL — ABNORMAL HIGH (ref 6–23)
Calcium: 9.5 mg/dL (ref 8.4–10.5)
GFR calc Af Amer: 51 mL/min — ABNORMAL LOW (ref >90)
GFR calc non Af Amer: 44 mL/min — ABNORMAL LOW (ref >90)
Glucose, Bld: 126 mg/dL — ABNORMAL HIGH (ref 70–99)

## 2012-11-15 LAB — CG4 I-STAT (LACTIC ACID): Lactic Acid, Venous: 2.52 mmol/L — ABNORMAL HIGH (ref 0.5–2.2)

## 2012-11-15 LAB — PROTIME-INR: INR: 2.06 — ABNORMAL HIGH (ref 0.00–1.49)

## 2012-11-15 MED ORDER — TIOTROPIUM BROMIDE MONOHYDRATE 18 MCG IN CAPS
18.0000 ug | ORAL_CAPSULE | Freq: Every day | RESPIRATORY_TRACT | Status: DC
  Administered 2012-11-16 – 2012-11-22 (×7): 18 ug via RESPIRATORY_TRACT
  Filled 2012-11-15 (×2): qty 5

## 2012-11-15 MED ORDER — WARFARIN SODIUM 2.5 MG PO TABS: 2.5000 mg | ORAL_TABLET | Freq: Every day | ORAL | Status: DC

## 2012-11-15 MED ORDER — CALCIUM CARBONATE-VITAMIN D 500-200 MG-UNIT PO TABS
1.0000 | ORAL_TABLET | Freq: Two times a day (BID) | ORAL | Status: DC
  Administered 2012-11-15 – 2012-11-22 (×14): 1 via ORAL
  Filled 2012-11-15 (×16): qty 1

## 2012-11-15 MED ORDER — COQ10 100 MG PO CAPS: 100.0000 mg/d | ORAL_CAPSULE | ORAL | Status: DC

## 2012-11-15 MED ORDER — ALBUTEROL SULFATE (5 MG/ML) 0.5% IN NEBU: 2.5000 mg | INHALATION_SOLUTION | RESPIRATORY_TRACT | Status: DC | PRN

## 2012-11-15 MED ORDER — VANCOMYCIN HCL IN DEXTROSE 1-5 GM/200ML-% IV SOLN
1000.0000 mg | Freq: Once | INTRAVENOUS | Status: AC
  Administered 2012-11-15: 1000 mg via INTRAVENOUS
  Filled 2012-11-15: qty 200

## 2012-11-15 MED ORDER — ALUM & MAG HYDROXIDE-SIMETH 200-200-20 MG/5ML PO SUSP: 30.0000 mL | Freq: Four times a day (QID) | ORAL | Status: DC | PRN

## 2012-11-15 MED ORDER — ENOXAPARIN SODIUM 40 MG/0.4ML ~~LOC~~ SOLN: 40.0000 mg | SUBCUTANEOUS | Status: DC

## 2012-11-15 MED ORDER — POLYETHYLENE GLYCOL 3350 17 G PO PACK
17.0000 g | PACK | Freq: Every day | ORAL | Status: DC | PRN
  Filled 2012-11-15: qty 1

## 2012-11-15 MED ORDER — GABAPENTIN 300 MG PO CAPS
300.0000 mg | ORAL_CAPSULE | Freq: Every day | ORAL | Status: DC
  Administered 2012-11-16 – 2012-11-18 (×3): 300 mg via ORAL
  Filled 2012-11-15 (×3): qty 1

## 2012-11-15 MED ORDER — ACETAMINOPHEN 325 MG PO TABS
650.0000 mg | ORAL_TABLET | Freq: Four times a day (QID) | ORAL | Status: DC | PRN
Start: 2012-11-15 — End: 2012-11-22
  Administered 2012-11-16 – 2012-11-22 (×6): 650 mg via ORAL
  Filled 2012-11-15 (×6): qty 2

## 2012-11-15 MED ORDER — ACETAMINOPHEN 325 MG PO TABS
650.0000 mg | ORAL_TABLET | Freq: Once | ORAL | Status: AC
  Administered 2012-11-15: 650 mg via ORAL
  Filled 2012-11-15: qty 2

## 2012-11-15 MED ORDER — SODIUM CHLORIDE 0.9 % IV BOLUS (SEPSIS)
500.0000 mL | Freq: Once | INTRAVENOUS | Status: AC
  Administered 2012-11-15: 500 mL via INTRAVENOUS

## 2012-11-15 MED ORDER — ONDANSETRON HCL 4 MG/2ML IJ SOLN: 4.0000 mg | Freq: Four times a day (QID) | INTRAMUSCULAR | Status: DC | PRN

## 2012-11-15 MED ORDER — CHOLECALCIFEROL 10 MCG (400 UNIT) PO TABS
400.0000 [IU] | ORAL_TABLET | Freq: Two times a day (BID) | ORAL | Status: DC
  Administered 2012-11-15 – 2012-11-22 (×14): 400 [IU] via ORAL
  Filled 2012-11-15 (×15): qty 1

## 2012-11-15 MED ORDER — SODIUM CHLORIDE 0.9 % IV SOLN
INTRAVENOUS | Status: DC
  Administered 2012-11-15: via INTRAVENOUS

## 2012-11-15 MED ORDER — WARFARIN - PHARMACIST DOSING INPATIENT: Freq: Every day | Status: DC

## 2012-11-15 MED ORDER — ACETAMINOPHEN 650 MG RE SUPP: 650.0000 mg | Freq: Four times a day (QID) | RECTAL | Status: DC | PRN

## 2012-11-15 MED ORDER — ALPRAZOLAM 0.25 MG PO TABS: 0.2500 mg | ORAL_TABLET | Freq: Three times a day (TID) | ORAL | Status: DC | PRN

## 2012-11-15 MED ORDER — LEVOTHYROXINE SODIUM 75 MCG PO TABS
75.0000 ug | ORAL_TABLET | Freq: Every day | ORAL | Status: DC
  Administered 2012-11-16 – 2012-11-22 (×7): 75 ug via ORAL
  Filled 2012-11-15 (×8): qty 1

## 2012-11-15 MED ORDER — PIPERACILLIN-TAZOBACTAM 3.375 G IVPB 30 MIN
3.3750 g | Freq: Once | INTRAVENOUS | Status: AC
  Administered 2012-11-15: 3.375 g via INTRAVENOUS
  Filled 2012-11-15 (×2): qty 50

## 2012-11-15 MED ORDER — VANCOMYCIN HCL IN DEXTROSE 750-5 MG/150ML-% IV SOLN
750.0000 mg | INTRAVENOUS | Status: DC
  Administered 2012-11-16 – 2012-11-18 (×3): 750 mg via INTRAVENOUS
  Filled 2012-11-15 (×4): qty 150

## 2012-11-15 MED ORDER — GABAPENTIN 300 MG PO CAPS: 300.0000 mg | ORAL_CAPSULE | ORAL | Status: DC

## 2012-11-15 MED ORDER — PIPERACILLIN-TAZOBACTAM 3.375 G IVPB
3.3750 g | Freq: Three times a day (TID) | INTRAVENOUS | Status: DC
  Administered 2012-11-16 – 2012-11-20 (×13): 3.375 g via INTRAVENOUS
  Filled 2012-11-15 (×14): qty 50

## 2012-11-15 MED ORDER — GABAPENTIN 300 MG PO CAPS
600.0000 mg | ORAL_CAPSULE | Freq: Two times a day (BID) | ORAL | Status: DC
  Administered 2012-11-15 – 2012-11-18 (×6): 600 mg via ORAL
  Filled 2012-11-15 (×7): qty 2

## 2012-11-15 MED ORDER — HYDROCODONE-ACETAMINOPHEN 5-325 MG PO TABS
1.0000 | ORAL_TABLET | Freq: Three times a day (TID) | ORAL | Status: DC | PRN
  Administered 2012-11-17 – 2012-11-18 (×2): 2 via ORAL
  Administered 2012-11-18: 1 via ORAL
  Filled 2012-11-15: qty 1
  Filled 2012-11-15 (×2): qty 2

## 2012-11-15 MED ORDER — ONDANSETRON HCL 4 MG PO TABS: 4.0000 mg | ORAL_TABLET | Freq: Four times a day (QID) | ORAL | Status: DC | PRN

## 2012-11-15 MED ORDER — POLYSACCHARIDE IRON COMPLEX 150 MG PO CAPS
150.0000 mg | ORAL_CAPSULE | ORAL | Status: DC
  Administered 2012-11-17 – 2012-11-21 (×3): 150 mg via ORAL
  Filled 2012-11-15 (×3): qty 1

## 2012-11-15 NOTE — ED Provider Notes (Signed)
CSN: [id number]     Arrival date & time 11/15/12  1917 History   First MD Initiated Contact with Patient 11/15/12 1928     Chief Complaint  Patient presents with  . Leg Swelling   (Consider location/radiation/quality/duration/timing/severity/associated sxs/prior Treatment) HPI Comments: 79 year old female presents to the ER for right lower strandy swelling. She states she's had some pain for a few days and then the nursing home noticed that the right lower leg was hot, erythematous, and swollen. He states they told her she may have had a fever recently as well. The patient states that she's not any weakness or numbness but diffuse pain to her lower extremity. Denies any CP or dyspnea. The daughter notes that the patient is currently on levaquin for a recent respiratory infection that could be pneumonia.   Past Medical History  Diagnosis Date  . Chronic atrial fibrillation   . History of GI bleed   . Neuropathy   . Fatigue   . Palpitations   . SOB (shortness of breath)   . OA (osteoarthritis)   . Weakness   . Forgetfulness   . Anxiety   . Depression    Past Surgical History  Procedure Laterality Date  . Umbilical hernia repair    . Appendectomy    . Knee surgery    . Tonsillectomy    . Vaginal hysterectomy    . US echocardiography  08/07/2002    EF 60-65%   Family History  Problem Relation Age of Onset  . Gallbladder disease Mother   . Pancreatic cancer Father    History  Substance Use Topics  . Smoking status: Never Smoker   . Smokeless tobacco: Not on file  . Alcohol Use: No   OB History   Grav Para Term Preterm Abortions TAB SAB Ect Mult Living                 Review of Systems  Constitutional: Positive for fever.  Respiratory: Negative for cough and shortness of breath.   Cardiovascular: Positive for leg swelling. Negative for chest pain.  Gastrointestinal: Negative for vomiting.  Skin: Positive for color change.  Neurological: Negative for weakness and  numbness.  All other systems reviewed and are negative.    Allergies  Cymbalta  Home Medications   Current Outpatient Rx  Name  Route  Sig  Dispense  Refill  . ALPRAZolam (XANAX) 0.25 MG tablet   Oral   Take 0.125-0.25 mg by mouth every 6 (six) hours as needed. For anxiety         . calcium-vitamin D (OSCAL WITH D) 500-200 MG-UNIT per tablet   Oral   Take 1 tablet by mouth 2 (two) times daily.           . Coenzyme Q10 (COQ10) 100 MG CAPS   Oral   Take 100 mg/day by mouth 1 day or 1 dose.   30 each   12   . gabapentin (NEURONTIN) 300 MG capsule   Oral   Take 300-600 mg by mouth See admin instructions. Takes 2 capsules every morning(600mg  dosage) and take 1 capsule at 12noon(300mg  dosage), and take 2 capsules at bedtime (600mg  dosage)         . HYDROcodone-acetaminophen (NORCO) 5-325 MG per tablet   Oral   Take 1-2 tablets by mouth every 8 (eight) hours as needed. For mild to moderate pain          . levofloxacin (LEVAQUIN) 500 MG tablet   Oral  Take 500 mg by mouth daily. For 7 days. Started on 11/14/12         . levothyroxine (SYNTHROID, LEVOTHROID) 75 MCG tablet   Oral   Take 75 mcg by mouth daily.           . polyethylene glycol (MIRALAX / GLYCOLAX) packet   Oral   Take 17 g by mouth daily as needed. For constipation         . polysaccharide iron (NIFEREX) 150 MG CAPS capsule   Oral   Take 150 mg by mouth every other day.          . tiotropium (SPIRIVA) 18 MCG inhalation capsule   Inhalation   Place 18 mcg into inhaler and inhale daily.          . Vitamin D, Cholecalciferol, 400 UNITS CHEW   Oral   Chew 1 tablet (400 Units total) by mouth 2 (two) times daily.   60 tablet   3   . warfarin (COUMADIN) 2.5 MG tablet   Oral   Take 2.5 mg by mouth See admin instructions.          Marland Kitchen EXPIRED: omega-3 acid ethyl esters (LOVAZA) 1 G capsule   Oral   Take 1 capsule (1 g total) by mouth every evening.   30 capsule   12   . EXPIRED:  potassium chloride (KLOR-CON) 8 MEQ tablet   Oral   Take 1 tablet (8 mEq total) by mouth 2 (two) times daily.   60 tablet   2    BP 133/63  Pulse 86  Temp(Src) 100.2 F (37.9 C) (Oral)  SpO2 97% Physical Exam  Nursing note and vitals reviewed. Constitutional: She is oriented to person, place, and time. She appears well-developed and well-nourished. No distress.  HENT:  Head: Normocephalic and atraumatic.  Right Ear: External ear normal.  Left Ear: External ear normal.  Nose: Nose normal.  Eyes: Right eye exhibits no discharge. Left eye exhibits no discharge.  Cardiovascular: Normal rate, regular rhythm and normal heart sounds.   Pulmonary/Chest: Effort normal and breath sounds normal.  Abdominal: Soft. She exhibits no distension. There is no tenderness.  Neurological: She is alert and oriented to person, place, and time.  Skin: Skin is warm and dry. There is erythema.  Diffuse swelling and erythema from right foot 2 right knee. Above the knee there is asymmetric streaking    ED Course  Procedures (including critical care time) Labs Review Labs Reviewed  CBC WITH DIFFERENTIAL - Abnormal; Notable for the following:    RBC 3.05 (*)    Hemoglobin 9.9 (*)    HCT 29.5 (*)    RDW 15.6 (*)    Neutrophils Relative % 78 (*)    Lymphocytes Relative 11 (*)    All other components within normal limits  BASIC METABOLIC PANEL - Abnormal; Notable for the following:    Glucose, Bld 126 (*)    BUN 24 (*)    GFR calc non Af Amer 44 (*)    GFR calc Af Amer 51 (*)    All other components within normal limits  PROTIME-INR - Abnormal; Notable for the following:    Prothrombin Time 22.6 (*)    INR 2.06 (*)    All other components within normal limits  CG4 I-STAT (LACTIC ACID) - Abnormal; Notable for the following:    Lactic Acid, Venous 2.52 (*)    All other components within normal limits  CULTURE, BLOOD (ROUTINE X 2)  CULTURE, BLOOD (ROUTINE X 2)   Imaging Review Dg Tibia/fibula  Right Port  11/15/2012   CLINICAL DATA:  Right leg swelling  EXAM: PORTABLE RIGHT TIBIA AND FIBULA - 2 VIEW  COMPARISON:  None.  FINDINGS: Two views of the right tibia-fibula submitted. No acute fracture or subluxation. Osteoarthritic changes are noted right knee joint. Diffuse osteopenia.  IMPRESSION: No acute fracture or subluxation. Diffuse osteopenia. Osteoarthritic changes right knee joint.   Electronically Signed   By: Natasha Mead M.D.   On: 11/15/2012 20:48    EKG Interpretation   None       MDM   1. Cellulitis of right lower leg    Patient with RLE cellulitis, low grade temperature and borderline elevated lactate. VSS, otherwise appears well. Given the streaking and recent history of mild abrasion/trauma to that extremity, will cover with IV abx and admit to hospital.    Audree Camel, MD 11/15/12 2321

## 2012-11-15 NOTE — ED Notes (Addendum)
Per EMS, [facility] called due to pt's right leg being swollen and painful to touch since yesterday (staff noticed the swelling yesterday). Pedal pulses were palpated, extremity was warm to touch. Pt states her leg has been hurting for a week.

## 2012-11-15 NOTE — Progress Notes (Signed)
PHARMACIST - PHYSICIAN ORDER COMMUNICATION  CONCERNING: P&T Medication Policy on Herbal Medications  DESCRIPTION:  This patient's order for:  Coenzyme Q10  has been noted.  This product(s) is classified as an "herbal" or natural product. Due to a lack of definitive safety studies or FDA approval, nonstandard manufacturing practices, plus the potential risk of unknown drug-drug interactions while on inpatient medications, the Pharmacy and Therapeutics Committee does not permit the use of "herbal" or natural products of this type within [facility].   ACTION TAKEN: The pharmacy department is unable to verify this order at this time and your patient has been informed of this safety policy. Please reevaluate patient's clinical condition at discharge and address if the herbal or natural product(s) should be resumed at that time.  Thank you, Terrilee Files, PharmD 11/15/12

## 2012-11-15 NOTE — Progress Notes (Signed)
ANTICOAGULATION CONSULT NOTE - Initial Consult  Pharmacy Consult for Warfarin Indication: atrial fibrillation  Allergies  Allergen Reactions  . Cymbalta [Duloxetine Hcl]     Unknown reaction    Patient Measurements: Height: 5\' 2"  (157.5 cm) Weight: 146 lb (66.225 kg) IBW/kg (Calculated) : 50.1  Vital Signs: Temp: 100.7 F (38.2 C) (11/07 1944) Temp src: Rectal (11/07 1944) BP: 133/63 mmHg (11/07 1921) Pulse Rate: 86 (11/07 1921)  Labs:  Recent Labs  11/15/12 2005 11/15/12 2019  HGB  --  9.9*  HCT  --  29.5*  PLT  --  150  LABPROT 22.6*  --   INR 2.06*  --   CREATININE  --  1.02    Estimated Creatinine Clearance: 26.8 ml/min (by C-G formula based on Cr of 1.02).   Medical History: Past Medical History  Diagnosis Date  . Chronic atrial fibrillation   . History of GI bleed   . Neuropathy   . Fatigue   . Palpitations   . SOB (shortness of breath)   . OA (osteoarthritis)   . Weakness   . Forgetfulness   . Anxiety   . Depression     Medications:  Scheduled:  . calcium-vitamin D  1 tablet Oral BID  . cholecalciferol  400 Units Oral BID  . [START ON 11/16/2012] gabapentin  300 mg Oral Q lunch  . gabapentin  600 mg Oral BID  . [START ON 11/17/2012] iron polysaccharides  150 mg Oral QODAY  . [START ON 11/16/2012] levothyroxine  75 mcg Oral QAC breakfast  . [START ON 11/16/2012] piperacillin-tazobactam (ZOSYN)  IV  3.375 g Intravenous Q8H  . [START ON 11/16/2012] tiotropium  18 mcg Inhalation Daily  . [START ON 11/16/2012] vancomycin  750 mg Intravenous Q24H  . [START ON 11/16/2012] Warfarin - Pharmacist Dosing Inpatient   Does not apply q1800   Infusions:  . sodium chloride      Assessment:  79 yr female with a history of diastolic HF, CVA and AFib (on warfarin).  Complaint of right leg pain and swelling.  Patient known to pharmacy from dosing of Vancomycin and Zosyn for cellulitis and pneumonia  PTA patient on Warfarin 2.5mg  daily with last dose taken  11/15/12 @ 17:00  INR upon admission = 2.06  Pharmacy asked to continue warfarin dosing upon admission  Goal of Therapy:  INR 2-3   Plan:   D/C'ed order for enoxaparin as INR therapeutic on home regimen  No additional warfarin needed today (patient already took dose today)  Check daily PT/INR   Zadaya Cuadra, Joselyn Glassman, PharmD 11/15/2012,11:22 PM

## 2012-11-15 NOTE — H&P (Signed)
PATIENT DETAILS Name: [name] Age: 79 y.o. Sex: female Date of Birth: 1911/02/15 Admit Date: 11/15/2012 PCP:No primary provider on file.   CHIEF COMPLAINT:  Right leg pain and swelling for 2 days  HPI: [name] is a 79 y.o. female with a Past Medical History of diastolic heart failure, atrial fibrillation still on chronic Coumadin therapy, hypothyroidism, history of CVA with mild residual dysarthria who presents today with the above noted complaint. She is a resident of a assisted living facility in [facility], she claims that that approximately 2 days back she noted that her right leg was swollen, and slightly erythematous, with a past 24 hours swelling and erythema have significantly worsened. It has also become significantly tender. She has also noticed that there is streaking up to her thighs. Patient and her daughter (at bedside) claimed that yesterday patient was not feeling well, a chest x-ray was done and there was some suggestion of pneumonia and she was started on Levaquin. However, when the assisted living staff saw her later today, she was transferred to the emergency room for further evaluation. I was asked to admit this patient for further evaluation and treatment. There is no history of headache, chest pain, shortness of breath, nausea, vomiting or diarrhea. She denies any abdominal pain.   ALLERGIES:   Allergies  Allergen Reactions  . Cymbalta [Duloxetine Hcl]     Unknown reaction    PAST MEDICAL HISTORY: Past Medical History  Diagnosis Date  . Chronic atrial fibrillation   . History of GI bleed   . Neuropathy   . Fatigue   . Palpitations   . SOB (shortness of breath)   . OA (osteoarthritis)   . Weakness   . Forgetfulness   . Anxiety   . Depression     PAST SURGICAL HISTORY: Past Surgical History  Procedure Laterality Date  . Umbilical hernia repair    . Appendectomy    . Knee surgery    . Tonsillectomy    . Vaginal hysterectomy     . US echocardiography  08/07/2002    EF 60-65%    MEDICATIONS AT HOME: Prior to Admission medications   Medication Sig Start Date End Date Taking? Authorizing Provider  ALPRAZolam (XANAX) 0.25 MG tablet Take 0.125-0.25 mg by mouth every 6 (six) hours as needed. For anxiety   Yes Historical Provider, MD  calcium-vitamin D (OSCAL WITH D) 500-200 MG-UNIT per tablet Take 1 tablet by mouth 2 (two) times daily.     Yes Historical Provider, MD  Coenzyme Q10 (COQ10) 100 MG CAPS Take 100 mg/day by mouth 1 day or 1 dose. 12/30/10  Yes Jarome Matin, MD  gabapentin (NEURONTIN) 300 MG capsule Take 300-600 mg by mouth See admin instructions. Takes 2 capsules every morning(600mg  dosage) and take 1 capsule at 12noon(300mg  dosage), and take 2 capsules at bedtime (600mg  dosage)   Yes Historical Provider, MD  HYDROcodone-acetaminophen (NORCO) 5-325 MG per tablet Take 1-2 tablets by mouth every 8 (eight) hours as needed. For mild to moderate pain    Yes Historical Provider, MD  levofloxacin (LEVAQUIN) 500 MG tablet Take 500 mg by mouth daily. For 7 days. Started on 11/14/12   Yes Historical Provider, MD  levothyroxine (SYNTHROID, LEVOTHROID) 75 MCG tablet Take 75 mcg by mouth daily.     Yes Historical Provider, MD  polyethylene glycol (MIRALAX / GLYCOLAX) packet Take 17 g by mouth daily as needed. For constipation   Yes Historical Provider, MD  polysaccharide iron (NIFEREX)  150 MG CAPS capsule Take 150 mg by mouth every other day.    Yes Historical Provider, MD  tiotropium (SPIRIVA) 18 MCG inhalation capsule Place 18 mcg into inhaler and inhale daily.    Yes Historical Provider, MD  Vitamin D, Cholecalciferol, 400 UNITS CHEW Chew 1 tablet (400 Units total) by mouth 2 (two) times daily. 12/30/10  Yes Jarome Matin, MD  warfarin (COUMADIN) 2.5 MG tablet Take 2.5 mg by mouth daily.    Yes Historical Provider, MD  omega-3 acid ethyl esters (LOVAZA) 1 G capsule Take 1 capsule (1 g total) by mouth every evening.  12/30/10 12/30/11  Jarome Matin, MD  potassium chloride (KLOR-CON) 8 MEQ tablet Take 1 tablet (8 mEq total) by mouth 2 (two) times daily. 12/30/10 12/30/11  Jarome Matin, MD    FAMILY HISTORY: Family History  Problem Relation Age of Onset  . Gallbladder disease Mother   . Pancreatic cancer Father     SOCIAL HISTORY:  reports that she has never smoked. She does not have any smokeless tobacco history on file. She reports that she does not drink alcohol or use illicit drugs.  REVIEW OF SYSTEMS:  Constitutional:   No  weight loss, night sweats,  Fevers, chills, fatigue.  HEENT:    No headaches, Difficulty swallowing,Tooth/dental problems,Sore throat,  No sneezing, itching, ear ache, nasal congestion, post nasal drip,   Cardio-vascular: No chest pain,  Orthopnea, PND, swelling in lower extremities, anasarca,  dizziness, palpitations  GI:  No heartburn, indigestion, abdominal pain, nausea, vomiting, diarrhea, change in bowel habits, loss of appetite  Resp: No shortness of breath with exertion or at rest.  No excess mucus, no productive cough, No non-productive cough,  No coughing up of blood.No change in color of mucus.No wheezing.No chest wall deformity  Skin:  no rash or lesions.  GU:  no dysuria, change in color of urine, no urgency or frequency.  No flank pain.  Musculoskeletal: No joint pain or swelling.  No decreased range of motion.  No back pain.  Psych: No change in mood or affect. No depression or anxiety.  No memory loss.   PHYSICAL EXAM: Blood pressure 133/63, pulse 86, temperature 100.7 F (38.2 C), temperature source Rectal, height 5\' 2"  (1.575 m), weight 66.225 kg (146 lb), SpO2 97.00%.  General appearance :Awake, alert, not in any distress. Speech dysarthric at baseline. Not toxic Looking HEENT: Atraumatic and Normocephalic, pupils equally reactive to light and accomodation Neck: supple, no JVD. No cervical lymphadenopathy.  Chest:Good air entry  bilaterally, no added sounds  CVS: S1 S2 regular, no murmurs.  Abdomen: Bowel sounds present, Non tender and not distended with no gaurding, rigidity or rebound. Extremities: Right lower extremity from the ankle up to the knee is swollen, erythematous and tender. There is some streaking up to the thighs  Neurology: Awake alert, and oriented X 3,  Non focal Skin:No Rash Wounds:N/A  LABS ON ADMISSION:   Recent Labs  11/15/12 2019  NA 135  K 3.8  CL 98  CO2 26  GLUCOSE 126*  BUN 24*  CREATININE 1.02  CALCIUM 9.5   No results found for this basename: AST, ALT, ALKPHOS, BILITOT, PROT, ALBUMIN,  in the last 72 hours No results found for this basename: LIPASE, AMYLASE,  in the last 72 hours  Recent Labs  11/15/12 2019  WBC 9.6  NEUTROABS 7.5  HGB 9.9*  HCT 29.5*  MCV 96.7  PLT 150   No results found for this basename: CKTOTAL, CKMB,  CKMBINDEX, TROPONINI,  in the last 72 hours No results found for this basename: DDIMER,  in the last 72 hours No components found with this basename: POCBNP,    RADIOLOGIC STUDIES ON ADMISSION: Dg Tibia/fibula Right Port  11/15/2012   CLINICAL DATA:  Right leg swelling  EXAM: PORTABLE RIGHT TIBIA AND FIBULA - 2 VIEW  COMPARISON:  None.  FINDINGS: Two views of the right tibia-fibula submitted. No acute fracture or subluxation. Osteoarthritic changes are noted right knee joint. Diffuse osteopenia.  IMPRESSION: No acute fracture or subluxation. Diffuse osteopenia. Osteoarthritic changes right knee joint.   Electronically Signed   By: Natasha Mead M.D.   On: 11/15/2012 20:48    ASSESSMENT AND PLAN: Present on Admission:  . Cellulitis of right lower extremity  - Will continue with vancomycin and Zosyn that has been started in the emergency room.  - INR is therapeutic-she is on Coumadin, doubt she would have underlying DVT, therefore would not pursue a Doppler.  - Keep the right lower extremity elevated, have asked the nursing staff to demarcate the  cellulitic area.   .? HCAP vs Aspiration - Per daughter, she was recently see started on Levaquin for a presumed pneumonia that was seen on the x-rays recently. We will check a chest x-ray today, she would be an vancomycin and Zosyn. Given history of a dysarthria, some suspicion for aspiration. - Chest x-ray indeed shows a pneumonia, she will need a speech therapy evaluation.  . Atrial fibrillation - Continue with Coumadin, apparently she has had a few falls in the past. We will defer to her primary care practitioner whether or not to continue in a long-term setting.   . Gait instability - Suspect this is a chronic issue, PT evaluation will be ordered   . Hypothyroidism - Continue levothyroxine   . CHF (congestive heart failure) - Last echocardiogram 2012 showed preserved ejection fraction, suspect diastolic heart failure. Clinically compensated, will be on low rate of IV fluids-please watch out for any signs of fluid overload.   . Anemia - Chronic issue, suspect anemia of chronic disease. - Monitor H&H was inpatient and transfuse when necessary   Further plan will depend as patient's clinical course evolves and further radiologic and laboratory data become available. Patient will be monitored closely.   DVT Prophylaxis: Prophylactic Lovenox   Code Status: DNR- controlled with both patient and daughter at bedside, also has a outer facility DO NOT RESUSCITATE paperwork with her nursing home records  Total time spent for admission equals 45 minutes.  [facility] Pager [phone]  If 7PM-7AM, please contact night-coverage [email or web address] Password [email or web address] 11/15/2012, 10:00 PM

## 2012-11-15 NOTE — ED Notes (Signed)
Bed: WA23 Expected date:  Expected time:  Means of arrival:  Comments: EMS-weak 

## 2012-11-15 NOTE — Progress Notes (Signed)
ANTIBIOTIC CONSULT NOTE - INITIAL  Pharmacy Consult for:  Vancomycin, Zosyn Indication:  Sepsis  Allergies  Allergen Reactions  . Cymbalta [Duloxetine Hcl]     Unknown reaction    Patient Measurements: Height: 5\' 2"  (157.5 cm) Weight: 146 lb (66.225 kg) IBW/kg (Calculated) : 50.1  Vital Signs: Temp: 100.7 F (38.2 C) (11/07 1944) Temp src: Rectal (11/07 1944) BP: 133/63 mmHg (11/07 1921) Pulse Rate: 86 (11/07 1921)  Labs:  Recent Labs  11/15/12 2019  WBC 9.6  HGB 9.9*  PLT 150  CREATININE 1.02   Estimated Creatinine Clearance: 26.8 ml/min (by C-G formula based on Cr of 1.02).    Medical History: Past Medical History  Diagnosis Date  . Chronic atrial fibrillation   . History of GI bleed   . Neuropathy   . Fatigue   . Palpitations   . SOB (shortness of breath)   . OA (osteoarthritis)   . Weakness   . Forgetfulness   . Anxiety   . Depression     Medications:  Alprazolam (Xanax), calcium with vitamin D, Coenzyme Q10, gabapentin (Neurontin), hydrocodone-acetaminophen, levofloxacin (Levaquin), levothyroxine, omega-3 acid ethyl esters (Lovaza), polyethylene glycol (Miralax), polysaccharide iron (Niferex), potassium chloride, tiotropium (Spiriva) inhaler, cholecalciferol (vitamin D), warfarin (Coumadin)  Assessment:  Asked to assist with Zosyn and Vancomycin therapy for this 79 year-old female with swollen right leg and sepsis.  Antibiotic doses will be modified due to renal insufficiency.  Goals of Therapy:   Vancomycin trough levels 15-20 mcg/ml  Eradication of infection  Doses appropriate for renal function  Plan:   First doses of Vancomycin 1 gram and Zosyn 3.375 grams were given in the ED.  Vancomycin 750 mg IV every 24 hours, beginning on 11/16/12.  Zosyn 3.375 grams IV every 8 hours, each dose infused over 4 hours.   Polo Riley R.Ph. 11/15/2012,9:15 PM

## 2012-11-16 DIAGNOSIS — I5033 Acute on chronic diastolic (congestive) heart failure: Secondary | ICD-10-CM

## 2012-11-16 DIAGNOSIS — J811 Chronic pulmonary edema: Secondary | ICD-10-CM

## 2012-11-16 DIAGNOSIS — R0609 Other forms of dyspnea: Secondary | ICD-10-CM

## 2012-11-16 DIAGNOSIS — R269 Unspecified abnormalities of gait and mobility: Secondary | ICD-10-CM

## 2012-11-16 DIAGNOSIS — E039 Hypothyroidism, unspecified: Secondary | ICD-10-CM

## 2012-11-16 LAB — GLUCOSE, CAPILLARY: Glucose-Capillary: 101 mg/dL — ABNORMAL HIGH (ref 70–99)

## 2012-11-16 LAB — COMPREHENSIVE METABOLIC PANEL
AST: 15 U/L (ref 0–37)
Albumin: 2.9 g/dL — ABNORMAL LOW (ref 3.5–5.2)
Alkaline Phosphatase: 49 U/L (ref 39–117)
BUN: 22 mg/dL (ref 6–23)
Chloride: 100 mEq/L (ref 96–112)
Glucose, Bld: 105 mg/dL — ABNORMAL HIGH (ref 70–99)
Potassium: 3.6 mEq/L (ref 3.5–5.1)
Total Bilirubin: 1.4 mg/dL — ABNORMAL HIGH (ref 0.3–1.2)

## 2012-11-16 LAB — CBC
HCT: 28.9 % — ABNORMAL LOW (ref 36.0–46.0)
Hemoglobin: 9.5 g/dL — ABNORMAL LOW (ref 12.0–15.0)
RDW: 15.7 % — ABNORMAL HIGH (ref 11.5–15.5)
WBC: 8.1 10*3/uL (ref 4.0–10.5)

## 2012-11-16 LAB — PROTIME-INR: Prothrombin Time: 23.2 seconds — ABNORMAL HIGH (ref 11.6–15.2)

## 2012-11-16 LAB — MRSA PCR SCREENING: MRSA by PCR: NEGATIVE

## 2012-11-16 MED ORDER — WARFARIN SODIUM 2.5 MG PO TABS
2.5000 mg | ORAL_TABLET | Freq: Once | ORAL | Status: AC
  Administered 2012-11-16: 2.5 mg via ORAL
  Filled 2012-11-16: qty 1

## 2012-11-16 MED ORDER — FUROSEMIDE 10 MG/ML IJ SOLN
20.0000 mg | Freq: Once | INTRAMUSCULAR | Status: AC
  Administered 2012-11-16: 20 mg via INTRAVENOUS
  Filled 2012-11-16: qty 2

## 2012-11-16 NOTE — Evaluation (Signed)
Physical Therapy Evaluation Patient Details Name: [name] MRN: [id number] DOB: Apr 01, 1911 Today's Date: 11/16/2012 Time: 9147-8295 PT Time Calculation (min): 15 min  PT Assessment / Plan / Recommendation History of Present Illness   79 y.o. female with a Past Medical History of diastolic heart failure, atrial fibrillation still on chronic Coumadin therapy, hypothyroidism, history of CVA with mild residual dysarthria who presents today with the above noted complaint. She is a resident of a assisted living facility in [facility], she claims that that approximately 2 days back she noted that her right leg was swollen, and slightly erythematous, with a past 24 hours swelling and erythema have significantly worsened. It has also become significantly tender. She has also noticed that there is streaking up to her thighs.  Clinical Impression  Pt admitted with R LE cellulitis. Pt currently with functional limitations due to the deficits listed below (see PT Problem List).  Pt will benefit from skilled PT to increase their independence and safety with mobility to allow discharge to the venue listed below.  Pt reports she is from ALF however does not require assist and is ambulatory with rollator.     PT Assessment  Patient needs continued PT services    Follow Up Recommendations  SNF    Does the patient have the potential to tolerate intense rehabilitation      Barriers to Discharge        Equipment Recommendations  None recommended by PT    Recommendations for Other Services     Frequency Min 3X/week    Precautions / Restrictions Precautions Precautions: Fall   Pertinent Vitals/Pain Unable to rate with numerical scale however states moderate R LE pain with mobility and WBing, elevated LEs and educated to perform ankle pumps      Mobility  Bed Mobility Bed Mobility: Not assessed Transfers Transfers: Sit to Stand;Stand to Sit Sit to Stand: 4: Min assist;With upper  extremity assist;From chair/3-in-1;With armrests Stand to Sit: 4: Min assist;With upper extremity assist;To chair/3-in-1;With armrests Details for Transfer Assistance: verbal cues for technique, pt able to better assist with cues for both hands on armrests  Ambulation/Gait Ambulation/Gait Assistance: 4: Min assist Ambulation Distance (Feet): 20 Feet Assistive device: Rolling walker Ambulation/Gait Assistance Details: limited by R LE pain, encouraged increased WBing through upper body, pt more comfortable with forearms resting on RW during ambulation, will attempt to improve gait and posture next visit Gait Pattern: Step-through pattern;Decreased stride length;Trunk flexed Gait velocity: decreased    Exercises     PT Diagnosis: Difficulty walking;Acute pain  PT Problem List: Decreased strength;Decreased activity tolerance;Decreased mobility;Pain;Decreased knowledge of use of DME PT Treatment Interventions: DME instruction;Gait training;Functional mobility training;Therapeutic activities;Therapeutic exercise;Patient/family education     PT Goals(Current goals can be found in the care plan section) Acute Rehab PT Goals Patient Stated Goal: return to PLOF PT Goal Formulation: With patient Time For Goal Achievement: 11/30/12 Potential to Achieve Goals: Good  Visit Information  Last PT Received On: 11/16/12 Assistance Needed: +1 History of Present Illness:  79 y.o. female with a Past Medical History of diastolic heart failure, atrial fibrillation still on chronic Coumadin therapy, hypothyroidism, history of CVA with mild residual dysarthria who presents today with the above noted complaint. She is a resident of a assisted living facility in [facility], she claims that that approximately 2 days back she noted that her right leg was swollen, and slightly erythematous, with a past 24 hours swelling and erythema have significantly worsened. It has also  become significantly tender. She has also  noticed that there is streaking up to her thighs.       Prior Functioning  Home Living Family/patient expects to be discharged to:: Assisted living Home Equipment: Walker - 4 wheels Prior Function Level of Independence: Independent with assistive device(s) Comments: pt states she ambulates to meal heal 2x/day Communication Communication: No difficulties    Cognition  Cognition Arousal/Alertness: Awake/alert Behavior During Therapy: WFL for tasks assessed/performed Overall Cognitive Status: Within Functional Limits for tasks assessed    Extremity/Trunk Assessment Lower Extremity Assessment Lower Extremity Assessment: Generalized weakness;RLE deficits/detail RLE Deficits / Details: decreased AROM in R lower leg due to pain and edema   Balance    End of Session PT - End of Session Equipment Utilized During Treatment: Gait belt Activity Tolerance: Patient limited by pain Patient left: in chair;with call bell/phone within reach Nurse Communication:  (observed pt ambulating in hallway)  GP     Anayla Giannetti,KATHrine E 11/16/2012, 4:14 PM Zenovia Jarred, PT, DPT 11/16/2012 Pager: [phone]

## 2012-11-16 NOTE — Progress Notes (Addendum)
[facility] PROGRESS NOTE  [name] WUJ:[id number] DOB: 11-May-1911 DOA: 11/15/2012 PCP: No primary provider on file.  Assessment/Plan  Right lower extremity cellulitis, improving with vancomycin and zosyn.  Source may have been lower lateral calf skin tear or actinic keratosis on the medial shin.  Does not appear to have tinea pedis. -  Continue vancomycin and zosyn -  Continue lower extremity elevation  Pulmonary edema on CXR may be secondary to acute on chronic diastolic heart failure, EF 50-55% 2012.  Perhaps her previous presumptive pneumonia was due to diastolic heart failure. Also noted to have peak PA pressure with moderately dilated RV and moderately to severely dilated RA with moderate to severe TR previously -  D/c IVF -  Pro-BNP -  Daily weights -  Strict I/O -  One dose of IV lasix today -  ECHO  Fever, resolving.  CXR not definitely suggestive of HCAP or aspiration PNA.  May be related to cellulits -  Continue abx -  F/u blood cultures  Dysphagia due to CVA, stable.  Continue dysphagia 3 diet and monitor for signs of aspiration  Gait instability, likely chronic and secondary to CVA.  PT evaluation pending  Hypothyroidism, stable.  Continue synthroid  Normocytic anemia, likely anemia of chronic disease and iron deficiency, hemoglobin at baseline.  Continue iron  Chronic atrial fibrillation, HR stable.  Continue warfarin  Anxiety and depression, stable.  Continue prn xanax    COPD, stable.  Continue spiriva with albuterol prn  Thrombocytopenia, may be due to acute illness.  Repeat as outpatient  Diet:  Dysphagia 3 Access:  PIV IVF:  OFF Proph:  warfarin  Code Status: DNR Family Communication: patient alone Disposition Plan: pending further clinical improvement in LE cellulitis   Consultants:  None  Procedures:  CXR  XR right lower extremity  Antibiotics:  Vancomycin 11/7 >>  Zosyn 11/7 >>   HPI/Subjective:  Right  leg still painful, but redness and swelling are improving.  Denies fevers, chills, nausea, vomiting, diarrhea.  Denies SOB, but has mild cough  Objective: Filed Vitals:   11/15/12 2039 11/15/12 2230 11/16/12 0544 11/16/12 0916  BP:  125/79 111/73   Pulse:  89 85   Temp:  98.9 F (37.2 C) 99.2 F (37.3 C)   TempSrc:  Oral Oral   Height: 5\' 2"  (1.575 m)     Weight: 66.225 kg (146 lb) 66.24 kg (146 lb 0.5 oz)    SpO2:  96% 93% 94%    Intake/Output Summary (Last 24 hours) at 11/16/12 1154 Last data filed at 11/16/12 0618  Gross per 24 hour  Intake 383.33 ml  Output      1 ml  Net 382.33 ml   Filed Weights   11/15/12 2039 11/15/12 2230  Weight: 66.225 kg (146 lb) 66.24 kg (146 lb 0.5 oz)    Exam:   General:  CF, No acute distress, occasional cough  HEENT:  NCAT, MMM  Cardiovascular:  RRR, nl S1, S2 no mrg, 2+ pulses, warm extremities  Respiratory:  CTAB, no increased WOB  Abdomen:   NABS, soft, NT/ND  MSK:   Normal tone and bulk, RLE warm to touch, pink erythema that has receded from previous line of demarcation to below the knee, TTP, 2+ pitting edema RLE  Neuro:  Grossly intact  Data Reviewed: Basic Metabolic Panel:  Recent Labs Lab 11/15/12 2019 11/16/12 0505  NA 135 136  K 3.8 3.6  CL 98 100  CO2 26 27  GLUCOSE 126* 105*  BUN 24* 22  CREATININE 1.02 1.03  CALCIUM 9.5 9.2   Liver Function Tests:  Recent Labs Lab 11/16/12 0505  AST 15  ALT 9  ALKPHOS 49  BILITOT 1.4*  PROT 6.3  ALBUMIN 2.9*   No results found for this basename: LIPASE, AMYLASE,  in the last 168 hours No results found for this basename: AMMONIA,  in the last 168 hours CBC:  Recent Labs Lab 11/15/12 2019 11/16/12 0505  WBC 9.6 8.1  NEUTROABS 7.5  --   HGB 9.9* 9.5*  HCT 29.5* 28.9*  MCV 96.7 96.3  PLT 150 123*   Cardiac Enzymes: No results found for this basename: CKTOTAL, CKMB, CKMBINDEX, TROPONINI,  in the last 168 hours BNP (last 3 results) No results found for  this basename: PROBNP,  in the last 8760 hours CBG:  Recent Labs Lab 11/16/12 0816  GLUCAP 101*    Recent Results (from the past 240 hour(s))  MRSA PCR SCREENING     Status: None   Collection Time    11/15/12 11:15 PM      Result Value Range Status   MRSA by PCR NEGATIVE  NEGATIVE Final   Comment:            The GeneXpert MRSA Assay (FDA     approved for NASAL specimens     only), is one component of a     comprehensive MRSA colonization     surveillance program. It is not     intended to diagnose MRSA     infection nor to guide or     monitor treatment for     MRSA infections.     Studies: Dg Chest Port 1 View  11/16/2012   CLINICAL DATA:  Shortness of breath.  EXAM: PORTABLE CHEST - 1 VIEW  COMPARISON:  Chest radiograph performed 06/09/2011  FINDINGS: The lungs are relatively well expanded. Vascular congestion is noted, with mildly increased interstitial markings, raising concern for mild pulmonary edema. No definite pleural effusion or pneumothorax is seen.  The cardiomediastinal silhouette is enlarged. An apparent moderate to large hiatal hernia is seen. No acute osseous abnormalities are identified.  IMPRESSION: 1. Vascular congestion and cardiomegaly, with mildly increased interstitial markings, raising concern for mild pulmonary edema. 2. Moderate to large hiatal hernia seen.   Electronically Signed   By: Roanna Raider M.D.   On: 11/16/2012 01:38   Dg Tibia/fibula Right Port  11/15/2012   CLINICAL DATA:  Right leg swelling  EXAM: PORTABLE RIGHT TIBIA AND FIBULA - 2 VIEW  COMPARISON:  None.  FINDINGS: Two views of the right tibia-fibula submitted. No acute fracture or subluxation. Osteoarthritic changes are noted right knee joint. Diffuse osteopenia.  IMPRESSION: No acute fracture or subluxation. Diffuse osteopenia. Osteoarthritic changes right knee joint.   Electronically Signed   By: Natasha Mead M.D.   On: 11/15/2012 20:48    Scheduled Meds: . calcium-vitamin D  1 tablet  Oral BID  . cholecalciferol  400 Units Oral BID  . gabapentin  300 mg Oral Q lunch  . gabapentin  600 mg Oral BID  . [START ON 11/17/2012] iron polysaccharides  150 mg Oral QODAY  . levothyroxine  75 mcg Oral QAC breakfast  . piperacillin-tazobactam (ZOSYN)  IV  3.375 g Intravenous Q8H  . tiotropium  18 mcg Inhalation Daily  . vancomycin  750 mg Intravenous Q24H  . warfarin  2.5 mg Oral ONCE-1800  . Warfarin - Pharmacist Dosing Inpatient  Does not apply q1800   Continuous Infusions: . sodium chloride 50 mL/hr at 11/15/12 2338    Principal Problem:   Cellulitis Active Problems:   Atrial fibrillation   Gait instability   GERD (gastroesophageal reflux disease)   Hypothyroidism   Anemia   CHF (congestive heart failure)    Time spent: 30 min    [name], [facility]  [facility] Pager [phone]. If 7PM-7AM, please contact night-coverage at [email or web address], password [facility] 11/16/2012, 11:54 AM  LOS: 1 day

## 2012-11-16 NOTE — Progress Notes (Signed)
ANTICOAGULATION CONSULT NOTE - Follow-up  Pharmacy Consult for Warfarin Indication: atrial fibrillation  Allergies  Allergen Reactions  . Cymbalta [Duloxetine Hcl]     Unknown reaction    Patient Measurements: Height: 5\' 2"  (157.5 cm) Weight: 146 lb 0.5 oz (66.24 kg) IBW/kg (Calculated) : 50.1  Vital Signs: Temp: 99.2 F (37.3 C) (11/08 0544) Temp src: Oral (11/08 0544) BP: 111/73 mmHg (11/08 0544) Pulse Rate: 85 (11/08 0544)  Labs:  Recent Labs  11/15/12 2005 11/15/12 2019 11/16/12 0505  HGB  --  9.9* 9.5*  HCT  --  29.5* 28.9*  PLT  --  150 123*  LABPROT 22.6*  --  23.2*  INR 2.06*  --  2.14*  CREATININE  --  1.02 1.03    Estimated Creatinine Clearance: 26.6 ml/min (by C-G formula based on Cr of 1.03).   Assessment: 79 yr female with a history of diastolic HF, CVA and AFib (on warfarin).  Complaint of right leg pain and swelling. MD with low clinical suspicion for VTE, dopplers not performed, antibiotics on board for cellulitis  Patient known to pharmacy from dosing of Vancomycin and Zosyn for cellulitis and pneumonia  Pharmacy asked to continue warfarin dosing upon admission  PTA patient on Warfarin 2.5mg  daily with last dose taken 11/15/12 @ 17:00  INR in therapeutic range on admission  INR remains therapeutic at 2.14 this AM  Hgb low today at 9.5 (baseline appears to be 9/10), plts decreased to 123K (baseline appears to be >250K)  No bleeding noted  INR may be affected by antibiotics  Pt with dysphagia 3 diet ordered, no PO intake yet charted  Goal of Therapy:  INR 2-3   Plan:   Warfarin 2.5mg  PO x1 tonight  Daily PT/INR   CBC in AM and at least q72h  Thank you for the consult.  Tomi Bamberger, PharmD Clinical Pharmacist Pager: [phone] Pharmacy: [phone] 11/16/2012 10:52 AM

## 2012-11-17 DIAGNOSIS — I5033 Acute on chronic diastolic (congestive) heart failure: Secondary | ICD-10-CM

## 2012-11-17 DIAGNOSIS — M79609 Pain in unspecified limb: Secondary | ICD-10-CM

## 2012-11-17 DIAGNOSIS — I369 Nonrheumatic tricuspid valve disorder, unspecified: Secondary | ICD-10-CM

## 2012-11-17 LAB — CBC
HCT: 28.2 % — ABNORMAL LOW (ref 36.0–46.0)
Hemoglobin: 9.4 g/dL — ABNORMAL LOW (ref 12.0–15.0)
MCV: 96.6 fL (ref 78.0–100.0)
RBC: 2.92 MIL/uL — ABNORMAL LOW (ref 3.87–5.11)
WBC: 8.6 10*3/uL (ref 4.0–10.5)

## 2012-11-17 LAB — PROTIME-INR: INR: 2.24 — ABNORMAL HIGH (ref 0.00–1.49)

## 2012-11-17 LAB — GLUCOSE, CAPILLARY: Glucose-Capillary: 101 mg/dL — ABNORMAL HIGH (ref 70–99)

## 2012-11-17 MED ORDER — POTASSIUM CHLORIDE CRYS ER 20 MEQ PO TBCR
20.0000 meq | EXTENDED_RELEASE_TABLET | Freq: Once | ORAL | Status: AC
  Administered 2012-11-17: 20 meq via ORAL
  Filled 2012-11-17: qty 1

## 2012-11-17 MED ORDER — FUROSEMIDE 10 MG/ML IJ SOLN
20.0000 mg | Freq: Once | INTRAMUSCULAR | Status: AC
  Administered 2012-11-17: 20 mg via INTRAVENOUS
  Filled 2012-11-17 (×2): qty 2

## 2012-11-17 MED ORDER — WARFARIN SODIUM 2.5 MG PO TABS
2.5000 mg | ORAL_TABLET | Freq: Once | ORAL | Status: AC
  Administered 2012-11-17: 2.5 mg via ORAL
  Filled 2012-11-17: qty 1

## 2012-11-17 NOTE — Progress Notes (Signed)
[facility] PROGRESS NOTE  [name] ZOX:[id number] DOB: 07-Mar-1911 DOA: 11/15/2012 PCP: No primary provider on file.  Assessment/Plan  Right lower extremity cellulitis, minimal improvement since yesterday, still very painful.  Consider ischemia or PAD which may be interfering with healing -  Continue vancomycin and zosyn -  Continue lower extremity elevation -  Check ABI and arterial duplex  Pulmonary edema on CXR may be secondary to acute on chronic diastolic heart failure, EF 50-55% 2012.  Perhaps her previous presumptive pneumonia was due to diastolic heart failure. Also noted to have peak PA pressure with moderately dilated RV and moderately to severely dilated RA with moderate to severe TR previously -  Pro-BNP elevated -  Daily weights -  Strict I/O -  Additional dose of IV lasix today -  ECHO pending  Fever, resolving.  CXR not definitely suggestive of HCAP or aspiration PNA.  May be related to cellulits -  Continue abx -  blood cultures NGTD  Dysphagia due to CVA, stable.  Continue dysphagia 3 diet and monitor for signs of aspiration  Gait instability, likely chronic and secondary to CVA.  PT evaluation pending  Hypothyroidism, stable.  Continue synthroid  Normocytic anemia, likely anemia of chronic disease and iron deficiency, hemoglobin at baseline.  Continue iron  Chronic atrial fibrillation, HR stable.  Continue warfarin  Anxiety and depression, stable.  Continue prn xanax    COPD, stable.  Continue spiriva with albuterol prn  Thrombocytopenia, may be due to acute illness.  Repeat as outpatient  Diet:  Dysphagia 3 Access:  PIV IVF:  OFF Proph:  warfarin  Code Status: DNR Family Communication: patient alone Disposition Plan: pending further clinical improvement in LE cellulitis   Consultants:  None  Procedures:  CXR  XR right lower extremity  Antibiotics:  Vancomycin 11/7 >>  Zosyn 11/7 >>   HPI/Subjective:  Right  leg still painful with persistent redness.  Swelling has improved. Denies fevers, chills, nausea, vomiting, diarrhea.  Denies SOB, but has mild cough  Objective: Filed Vitals:   11/16/12 1528 11/16/12 2119 11/17/12 0454 11/17/12 0555  BP: 125/65 127/65  120/65  Pulse: 69 75  71  Temp: 98.3 F (36.8 C) 99.4 F (37.4 C)  99.9 F (37.7 C)  TempSrc: Oral Oral  Oral  Resp: 20 18  18   Height:      Weight:   69.5 kg (153 lb 3.5 oz)   SpO2: 94% 94%  92%    Intake/Output Summary (Last 24 hours) at 11/17/12 0850 Last data filed at 11/17/12 0600  Gross per 24 hour  Intake    530 ml  Output    500 ml  Net     30 ml   Filed Weights   11/15/12 2039 11/15/12 2230 11/17/12 0454  Weight: 66.225 kg (146 lb) 66.24 kg (146 lb 0.5 oz) 69.5 kg (153 lb 3.5 oz)    Exam:   General:  CF, No acute distress, "wet" sounding breath sounds  HEENT:  NCAT, MMM  Cardiovascular:  RRR, nl S1, S2 no mrg, warm extremities  Respiratory:  Course rales without rhonchi or wheeze, no increased WOB  Abdomen:   NABS, soft, NT/ND  MSK:   Normal tone and bulk, RLE warm to touch, pink erythema that has receded from previous line of demarcation to below the knee, TTP, 1+ pitting edema RLE, absent pedal pulse on the right   Neuro:  Grossly intact  Data Reviewed: Basic Metabolic Panel:  Recent Labs  Lab 11/15/12 2019 11/16/12 0505  NA 135 136  K 3.8 3.6  CL 98 100  CO2 26 27  GLUCOSE 126* 105*  BUN 24* 22  CREATININE 1.02 1.03  CALCIUM 9.5 9.2   Liver Function Tests:  Recent Labs Lab 11/16/12 0505  AST 15  ALT 9  ALKPHOS 49  BILITOT 1.4*  PROT 6.3  ALBUMIN 2.9*   No results found for this basename: LIPASE, AMYLASE,  in the last 168 hours No results found for this basename: AMMONIA,  in the last 168 hours CBC:  Recent Labs Lab 11/15/12 2019 11/16/12 0505 11/17/12 0512  WBC 9.6 8.1 8.6  NEUTROABS 7.5  --   --   HGB 9.9* 9.5* 9.4*  HCT 29.5* 28.9* 28.2*  MCV 96.7 96.3 96.6  PLT 150  123* 149*   Cardiac Enzymes: No results found for this basename: CKTOTAL, CKMB, CKMBINDEX, TROPONINI,  in the last 168 hours BNP (last 3 results)  Recent Labs  11/16/12 0505  PROBNP 2620.0*   CBG:  Recent Labs Lab 11/16/12 0816 11/17/12 0740  GLUCAP 101* 101*    Recent Results (from the past 240 hour(s))  CULTURE, BLOOD (ROUTINE X 2)     Status: None   Collection Time    11/15/12  8:19 PM      Result Value Range Status   Specimen Description BLOOD LEFT ARM   Final   Special Requests BOTTLES DRAWN AEROBIC AND ANAEROBIC   Final   Culture  Setup Time     Final   Value: 11/16/2012 01:38     Performed at [facility]   Culture     Final   Value:        BLOOD CULTURE RECEIVED NO GROWTH TO DATE CULTURE WILL BE HELD FOR 5 DAYS BEFORE ISSUING A FINAL NEGATIVE REPORT     Performed at [facility]   Report Status PENDING   Incomplete  CULTURE, BLOOD (ROUTINE X 2)     Status: None   Collection Time    11/15/12  8:19 PM      Result Value Range Status   Specimen Description BLOOD RIGHT ARM   Final   Special Requests BOTTLES DRAWN AEROBIC AND ANAEROBIC   Final   Culture  Setup Time     Final   Value: 11/16/2012 01:39     Performed at [facility]   Culture     Final   Value:        BLOOD CULTURE RECEIVED NO GROWTH TO DATE CULTURE WILL BE HELD FOR 5 DAYS BEFORE ISSUING A FINAL NEGATIVE REPORT     Performed at [facility]   Report Status PENDING   Incomplete  MRSA PCR SCREENING     Status: None   Collection Time    11/15/12 11:15 PM      Result Value Range Status   MRSA by PCR NEGATIVE  NEGATIVE Final   Comment:            The GeneXpert MRSA Assay (FDA     approved for NASAL specimens     only), is one component of a     comprehensive MRSA colonization     surveillance program. It is not     intended to diagnose MRSA     infection nor to guide or     monitor treatment for     MRSA infections.     Studies: Dg Chest Port 1  View  11/16/2012   CLINICAL DATA:  Shortness of breath.  EXAM: PORTABLE CHEST - 1 VIEW  COMPARISON:  Chest radiograph performed 06/09/2011  FINDINGS: The lungs are relatively well expanded. Vascular congestion is noted, with mildly increased interstitial markings, raising concern for mild pulmonary edema. No definite pleural effusion or pneumothorax is seen.  The cardiomediastinal silhouette is enlarged. An apparent moderate to large hiatal hernia is seen. No acute osseous abnormalities are identified.  IMPRESSION: 1. Vascular congestion and cardiomegaly, with mildly increased interstitial markings, raising concern for mild pulmonary edema. 2. Moderate to large hiatal hernia seen.   Electronically Signed   By: Roanna Raider M.D.   On: 11/16/2012 01:38   Dg Tibia/fibula Right Port  11/15/2012   CLINICAL DATA:  Right leg swelling  EXAM: PORTABLE RIGHT TIBIA AND FIBULA - 2 VIEW  COMPARISON:  None.  FINDINGS: Two views of the right tibia-fibula submitted. No acute fracture or subluxation. Osteoarthritic changes are noted right knee joint. Diffuse osteopenia.  IMPRESSION: No acute fracture or subluxation. Diffuse osteopenia. Osteoarthritic changes right knee joint.   Electronically Signed   By: Natasha Mead M.D.   On: 11/15/2012 20:48    Scheduled Meds: . calcium-vitamin D  1 tablet Oral BID  . cholecalciferol  400 Units Oral BID  . furosemide  20 mg Intravenous Once  . gabapentin  300 mg Oral Q lunch  . gabapentin  600 mg Oral BID  . iron polysaccharides  150 mg Oral QODAY  . levothyroxine  75 mcg Oral QAC breakfast  . piperacillin-tazobactam (ZOSYN)  IV  3.375 g Intravenous Q8H  . potassium chloride  20 mEq Oral Once  . tiotropium  18 mcg Inhalation Daily  . vancomycin  750 mg Intravenous Q24H  . Warfarin - Pharmacist Dosing Inpatient   Does not apply q1800   Continuous Infusions:    Principal Problem:   Cellulitis Active Problems:   Atrial fibrillation   Gait instability   GERD  (gastroesophageal reflux disease)   Hypothyroidism   Anemia   CHF (congestive heart failure)   Pulmonary edema   Acute on chronic diastolic heart failure    Time spent: 30 min    Gurfateh Mcclain  [facility] Pager [phone]. If 7PM-7AM, please contact night-coverage at [email or web address], password [facility] 11/17/2012, 8:50 AM  LOS: 2 days

## 2012-11-17 NOTE — Progress Notes (Addendum)
VASCULAR LAB PRELIMINARY  ARTERIAL  ABI completed:    RIGHT    LEFT    PRESSURE WAVEFORM  PRESSURE WAVEFORM  BRACHIAL 126 triphasic BRACHIAL 123 triphasic  DP   DP    AT 91 Dampened monophasic AT 133 Dampened monophasic  PT 103 monophasic PT 133 Dampened monophasic  PER   PER    GREAT TOE 87  GREAT TOE  adequate    RIGHT LEFT  ABI 0.82 >1.0     Kamari Buch, RVT 11/17/2012, 9:06 AM

## 2012-11-17 NOTE — Progress Notes (Signed)
Clinical Social Work Department BRIEF PSYCHOSOCIAL ASSESSMENT 11/17/2012  Patient:  [name], [name]     Account Number:  [id number]     Admit date:  11/15/2012  Clinical Social Worker:  Doroteo Glassman  Date/Time:  11/17/2012 04:23 PM  Referred by:  Physician  Date Referred:  11/17/2012 Referred for  SNF Placement   Other Referral:   Interview type:  Patient Other interview type:    PSYCHOSOCIAL DATA Living Status:  FACILITY Admitted from facility:   Level of care:  Assisted Living Primary support name:  Lupita Leash Primary support relationship to patient:  CHILD, ADULT Degree of support available:   strong    CURRENT CONCERNS Current Concerns  Post-Acute Placement   Other Concerns:    SOCIAL WORK ASSESSMENT / PLAN Met with Pt to discuss d/c plans.  Reviewed MD and PT's recommendations.    Pt stated that she lives in an [facility] living [facility].  She couldn't remember how long she has lived there but stated that she liked it and that she will go back there upon d/c.    Pt was not interested in SNF.    CSW provided Pt with a SNF list, should Pt change her mind.    CSW thanked Pt for her time.    Weekday CSW to follow.   Assessment/plan status:  Psychosocial Support/Ongoing Assessment of Needs Other assessment/ plan:   Information/referral to community resources:   SNF list    PATIENT'S/FAMILY'S RESPONSE TO PLAN OF CARE: Pt's response to her plan of care was concern.  She stated that she doesn't want to go to SNF; she wants to return to her ALF.  Pt stated that the ALF can meet all of her needs.    Pt was pleasant and cooperative.    Pt thanked CSW for time and assistance.   Providence Crosby, LCSWA Clinical Social Work [phone]

## 2012-11-17 NOTE — Progress Notes (Signed)
Echocardiogram 2D Echocardiogram has been performed.  [name] 11/17/2012, 3:17 PM

## 2012-11-17 NOTE — Progress Notes (Signed)
ANTICOAGULATION CONSULT NOTE - Follow-up  Pharmacy Consult for Warfarin Indication: atrial fibrillation  Allergies  Allergen Reactions  . Cymbalta [Duloxetine Hcl]     Unknown reaction    Patient Measurements: Height: 5\' 2"  (157.5 cm) Weight: 153 lb 3.5 oz (69.5 kg) IBW/kg (Calculated) : 50.1  Vital Signs: Temp: 99.9 F (37.7 C) (11/09 0555) Temp src: Oral (11/09 0555) BP: 120/65 mmHg (11/09 0555) Pulse Rate: 71 (11/09 0555)  Labs:  Recent Labs  11/15/12 2005  11/15/12 2019 11/16/12 0505 11/17/12 0512  HGB  --   < > 9.9* 9.5* 9.4*  HCT  --   --  29.5* 28.9* 28.2*  PLT  --   --  150 123* 149*  LABPROT 22.6*  --   --  23.2* 24.1*  INR 2.06*  --   --  2.14* 2.24*  CREATININE  --   --  1.02 1.03  --   < > = values in this interval not displayed.  Estimated Creatinine Clearance: 27.2 ml/min (by C-G formula based on Cr of 1.03).   Assessment: 79 yr female with a history of diastolic HF, CVA and AFib (on warfarin).  Complaint of right leg pain and swelling. MD with low clinical suspicion for VTE, dopplers not performed, antibiotics on board for cellulitis. Pharmacy asked to continue warfarin dosing upon admission  PTA patient on Warfarin 2.5mg  daily with last dose taken 11/15/12 @ 17:00, INR in therapeutic range on admission  INR remains therapeutic at 2.24 this AM  Hgb low at 9.4 and stable from yesterday (baseline appears to be 9/10)  Plts increased to 149K (baseline appears to be >250K)  No bleeding noted  INR may be affected by antibiotics  Pt with dysphagia 3 diet ordered, 50-90% of meals eaten  Goal of Therapy:  INR 2-3   Plan:   Warfarin 2.5mg  PO x1 tonight  Will not enter a standing 2.5mg  dose yet, waiting to see if there is an INR increase with addition of antibiotics  Daily PT/INR   CBC in AM and at least q72h  Thank you for the consult.  Tomi Bamberger, PharmD Clinical Pharmacist Pager: [phone] Pharmacy: [phone] 11/17/2012 10:01 AM

## 2012-11-18 LAB — BASIC METABOLIC PANEL
CO2: 28 mEq/L (ref 19–32)
Chloride: 101 mEq/L (ref 96–112)
GFR calc non Af Amer: 46 mL/min — ABNORMAL LOW (ref >90)
Potassium: 3.6 mEq/L (ref 3.5–5.1)
Sodium: 136 mEq/L (ref 135–145)

## 2012-11-18 LAB — PROTIME-INR
INR: 2.4 — ABNORMAL HIGH (ref 0.00–1.49)
Prothrombin Time: 25.4 seconds — ABNORMAL HIGH (ref 11.6–15.2)

## 2012-11-18 LAB — GLUCOSE, CAPILLARY: Glucose-Capillary: 105 mg/dL — ABNORMAL HIGH (ref 70–99)

## 2012-11-18 MED ORDER — GABAPENTIN 100 MG PO CAPS
200.0000 mg | ORAL_CAPSULE | Freq: Every day | ORAL | Status: DC
  Administered 2012-11-19 – 2012-11-20 (×2): 200 mg via ORAL
  Filled 2012-11-18 (×3): qty 2

## 2012-11-18 MED ORDER — TRAMADOL HCL 50 MG PO TABS: 50.0000 mg | ORAL_TABLET | Freq: Two times a day (BID) | ORAL | Status: DC

## 2012-11-18 MED ORDER — TRAMADOL HCL 50 MG PO TABS
50.0000 mg | ORAL_TABLET | Freq: Once | ORAL | Status: AC
  Administered 2012-11-18: 50 mg via ORAL
  Filled 2012-11-18: qty 1

## 2012-11-18 MED ORDER — GABAPENTIN 400 MG PO CAPS
400.0000 mg | ORAL_CAPSULE | Freq: Two times a day (BID) | ORAL | Status: DC
  Administered 2012-11-18 – 2012-11-21 (×6): 400 mg via ORAL
  Filled 2012-11-18 (×8): qty 1

## 2012-11-18 MED ORDER — WARFARIN SODIUM 2.5 MG PO TABS
2.5000 mg | ORAL_TABLET | Freq: Once | ORAL | Status: AC
  Administered 2012-11-18: 2.5 mg via ORAL
  Filled 2012-11-18: qty 1

## 2012-11-18 NOTE — Progress Notes (Signed)
Physical Therapy Treatment Patient Details Name: TYLAH MANCILLAS MRN: [id number] DOB: 1911/04/30 Today's Date: 11/18/2012 Time: 1010-1037 PT Time Calculation (min): 27 min  PT Assessment / Plan / Recommendation  History of Present Illness  79 y.o. female with a Past Medical History of diastolic heart failure, atrial fibrillation still on chronic Coumadin therapy, hypothyroidism, history of CVA with mild residual dysarthria who presents today with the above noted complaint. She is a resident of a assisted living facility in [facility], she claims that that approximately 2 days back she noted that her right leg was swollen, and slightly erythematous, with a past 24 hours swelling and erythema have significantly worsened. It has also become significantly tender. She has also noticed that there is streaking up to her thighs.   PT Comments   Pt required increased time and increased assist this session.  Assisted fro bed to [place] to void then to recliner.  Pt unable to fully support self with transfer and unable to functionally WB thru R LE due to MAX c/o pain.  So chair brought to pt.     Follow Up Recommendations  SNF (If unable to return to ALF)     Does the patient have the potential to tolerate intense rehabilitation     Barriers to Discharge        Equipment Recommendations       Recommendations for Other Services    Frequency Min 3X/week   Progress towards PT Goals    Plan      Precautions / Restrictions Precautions Precautions: Fall Precaution Comments: R LE cellulitis/pain Restrictions Weight Bearing Restrictions: No Other Position/Activity Restrictions: WBAT    Pertinent Vitals/Pain C/o R LE pain "alot" unable to rate or describe other than "It hurts"    Mobility  Bed Mobility Bed Mobility: Supine to Sit Supine to Sit: 2: Max assist Details for Bed Mobility Assistance: Max Assist to transisition from supine to EOB with increased time and assist with R  LE Transfers Transfers: Sit to Stand;Stand to Sit Sit to Stand: 2: Max assist Stand to Sit: 2: Max assist Details for Transfer Assistance: verbal cues for technique, pt able to better assist with cues for both hands on armrests partially but then unable to transistion hands on walker and def unable to pivot self 1/4 turn from bed to [facility].   Ambulation/Gait Ambulation/Gait Assistance Details: Unable to attempt due to R LE pain and inability to perform bed to [facility] transfer functionally/safely.      PT Goals (current goals can now be found in the care plan section)    Visit Information  Last PT Received On: 11/18/12 Assistance Needed: +1 History of Present Illness:  79 y.o. female with a Past Medical History of diastolic heart failure, atrial fibrillation still on chronic Coumadin therapy, hypothyroidism, history of CVA with mild residual dysarthria who presents today with the above noted complaint. She is a resident of a assisted living facility in [facility], she claims that that approximately 2 days back she noted that her right leg was swollen, and slightly erythematous, with a past 24 hours swelling and erythema have significantly worsened. It has also become significantly tender. She has also noticed that there is streaking up to her thighs.    Subjective Data      Cognition       Balance     End of Session PT - End of Session Equipment Utilized During Treatment: Gait belt Activity Tolerance: Patient limited by pain Patient left:  in chair;with call bell/phone within reach   Felecia Shelling  PTA [facility]  Acute  Rehab Pager      [phone]

## 2012-11-18 NOTE — Progress Notes (Signed)
ANTICOAGULATION CONSULT NOTE - Follow-up  Pharmacy Consult for Warfarin Indication: atrial fibrillation  Allergies  Allergen Reactions  . Cymbalta [Duloxetine Hcl]     Unknown reaction   Patient Measurements: Height: 5\' 2"  (157.5 cm) Weight: 148 lb 9.4 oz (67.4 kg) IBW/kg (Calculated) : 50.1  Labs:  Recent Labs  11/15/12 2019 11/16/12 0505 11/17/12 0512 11/18/12 0523  HGB 9.9* 9.5* 9.4*  --   HCT 29.5* 28.9* 28.2*  --   PLT 150 123* 149*  --   LABPROT  --  23.2* 24.1* 25.4*  INR  --  2.14* 2.24* 2.40*  CREATININE 1.02 1.03  --  0.99   Assessment: 79 yr female with a history of diastolic HF, CVA and AFib (on warfarin).  Complaint of right leg pain and swelling. MD with low clinical suspicion for VTE, dopplers not performed, antibiotics on board for cellulitis. Pharmacy asked to continue warfarin dosing upon admission  PTA patient on Warfarin 2.5mg  daily with last dose taken 11/15/12   INR remains therapeutic at 2.40 this AM (trending upward with usual home dose)  Hgb 9.4 (11/9), trend low,stable   Plts increased to 149K (11.9) (baseline appears to be >250K)  No bleeding noted  INR may be affected by antibiotics  Pt with dysphagia 3 diet ordered, 50-90% of meals eaten  Goal of Therapy:  INR 2-3   Plan:   Warfarin 2.5mg  PO x1 tonight  Daily PT/INR   CBC at least q72h  Thank you for the consult.  Otho Bellows PharmD Pager [phone] 11/18/2012, 8:37 AM

## 2012-11-18 NOTE — Progress Notes (Addendum)
[facility] PROGRESS NOTE  Ty Oshima Ghanem XBJ:[id number] DOB: [birth year] DOA: 11/15/2012 PCP: No primary provider on file.  Assessment/Plan  Right lower extremity cellulitis, no improvement since yesterday, still very painful.   -  Continue vancomycin and zosyn day 4 -  Continue lower extremity elevation  -   Right ABI 0.82 and monophasix, left > 1 -  Consider vascular surgery consultation if cellulitis still not improved in AM -  Not receiving pain medication -  Schedule tramadol  Pulmonary edema on CXR may be secondary to acute on chronic diastolic heart failure, EF 50-55% and unable to determine diastolic heart failure due to a-fib, RV moderately dilated with mildly reduced function, severe TR, and blunted IVC changes.  -  Want to avoid dehydration.  Patient eating and drinking minimally  -  Repeat CXR and pro-BNP in AM  Generalized weakness -  May be related to gabapentin, acute infection -  Reduce gabapentin slightly.  Per pharmacist, CrCl is 27 ml/min and maximum recommended dose is 700mg  per day.  Will reduce to 400mg  BID and 200mg  at lunchtime. -  PT rec SNF  Fever, resolved.  CXR not definitely suggestive of HCAP or aspiration PNA.  May be related to cellulits -  Continue abx -  blood cultures NGTD  Dysphagia due to CVA, stable.  Continue dysphagia 3 diet and monitor for signs of aspiration  Gait instability, likely chronic and secondary to CVA.  PT evaluation recommending SNF  Hypothyroidism, stable.  Continue synthroid  Normocytic anemia, likely anemia of chronic disease and iron deficiency, hemoglobin at baseline.  Continue iron  Chronic atrial fibrillation, HR stable.  Continue warfarin  Anxiety and depression, stable.  Continue prn xanax    COPD, stable.  Continue spiriva with albuterol prn  Thrombocytopenia, may be due to acute illness.  Repeat as outpatient  Diet:  Dysphagia 3 Access:  PIV IVF:  OFF Proph:  warfarin  Code Status:  DNR Family Communication: patient and left message for daughter Disposition Plan: pending further clinical improvement in LE cellulitis.  PT recommending SNF, however, family would really like her to return to ALF.    Consultants:  None  Procedures:  CXR  XR right lower extremity  Antibiotics:  Vancomycin 11/7 >>  Zosyn 11/7 >>   HPI/Subjective:  Right leg still painful with movement.  Swelling increased again today.  Denies fevers, chills, nausea, vomiting, diarrhea.  Persistent cough  Objective: Filed Vitals:   11/17/12 2130 11/18/12 0519 11/18/12 0925 11/18/12 1346  BP: 161/82 122/62  130/65  Pulse: 90 80  82  Temp: 98.2 F (36.8 C) 98.3 F (36.8 C)  98.6 F (37 C)  TempSrc: Oral Oral  Oral  Resp: 18 18  20   Height:      Weight:  67.4 kg (148 lb 9.4 oz)    SpO2: 95% 93% 92% 95%    Intake/Output Summary (Last 24 hours) at 11/18/12 1747 Last data filed at 11/17/12 1847  Gross per 24 hour  Intake      0 ml  Output    100 ml  Net   -100 ml   Filed Weights   11/15/12 2230 11/17/12 0454 11/18/12 0519  Weight: 66.24 kg (146 lb 0.5 oz) 69.5 kg (153 lb 3.5 oz) 67.4 kg (148 lb 9.4 oz)    Exam:   General:  CF, No acute distress  HEENT:  NCAT, MMM  Cardiovascular:  RRR, nl S1, S2 no mrg, warm extremities  Respiratory:  Rhonchorous  with exp wheeze, no focal rales, no increased WOB  Abdomen:   NABS, soft, NT/ND  MSK:   Normal tone and bulk, RLE warm to touch, pink erythema that has receded from previous line of demarcation to below the knee, TTP, 1+ pitting edema RLE, absent pedal pulse on the right, stable to slightly more swollen than yesterday  Neuro:  Grossly intact  Data Reviewed: Basic Metabolic Panel:  Recent Labs Lab 11/15/12 2019 11/16/12 0505 11/18/12 0523  NA 135 136 136  K 3.8 3.6 3.6  CL 98 100 101  CO2 26 27 28   GLUCOSE 126* 105* 107*  BUN 24* 22 21  CREATININE 1.02 1.03 0.99  CALCIUM 9.5 9.2 9.3   Liver Function  Tests:  Recent Labs Lab 11/16/12 0505  AST 15  ALT 9  ALKPHOS 49  BILITOT 1.4*  PROT 6.3  ALBUMIN 2.9*   No results found for this basename: LIPASE, AMYLASE,  in the last 168 hours No results found for this basename: AMMONIA,  in the last 168 hours CBC:  Recent Labs Lab 11/15/12 2019 11/16/12 0505 11/17/12 0512  WBC 9.6 8.1 8.6  NEUTROABS 7.5  --   --   HGB 9.9* 9.5* 9.4*  HCT 29.5* 28.9* 28.2*  MCV 96.7 96.3 96.6  PLT 150 123* 149*   Cardiac Enzymes: No results found for this basename: CKTOTAL, CKMB, CKMBINDEX, TROPONINI,  in the last 168 hours BNP (last 3 results)  Recent Labs  11/16/12 0505  PROBNP 2620.0*   CBG:  Recent Labs Lab 11/16/12 0816 11/17/12 0740 11/18/12 0734  GLUCAP 101* 101* 105*    Recent Results (from the past 240 hour(s))  CULTURE, BLOOD (ROUTINE X 2)     Status: None   Collection Time    11/15/12  8:19 PM      Result Value Range Status   Specimen Description BLOOD LEFT ARM   Final   Special Requests BOTTLES DRAWN AEROBIC AND ANAEROBIC   Final   Culture  Setup Time     Final   Value: 11/16/2012 01:38     Performed at [facility]   Culture     Final   Value:        BLOOD CULTURE RECEIVED NO GROWTH TO DATE CULTURE WILL BE HELD FOR 5 DAYS BEFORE ISSUING A FINAL NEGATIVE REPORT     Performed at [facility]   Report Status PENDING   Incomplete  CULTURE, BLOOD (ROUTINE X 2)     Status: None   Collection Time    11/15/12  8:19 PM      Result Value Range Status   Specimen Description BLOOD RIGHT ARM   Final   Special Requests BOTTLES DRAWN AEROBIC AND ANAEROBIC   Final   Culture  Setup Time     Final   Value: 11/16/2012 01:39     Performed at [facility]   Culture     Final   Value:        BLOOD CULTURE RECEIVED NO GROWTH TO DATE CULTURE WILL BE HELD FOR 5 DAYS BEFORE ISSUING A FINAL NEGATIVE REPORT     Performed at [facility]   Report Status PENDING   Incomplete  MRSA PCR SCREENING      Status: None   Collection Time    11/15/12 11:15 PM      Result Value Range Status   MRSA by PCR NEGATIVE  NEGATIVE Final   Comment:  The GeneXpert MRSA Assay (FDA     approved for NASAL specimens     only), is one component of a     comprehensive MRSA colonization     surveillance program. It is not     intended to diagnose MRSA     infection nor to guide or     monitor treatment for     MRSA infections.     Studies: No results found.  Scheduled Meds: . calcium-vitamin D  1 tablet Oral BID  . cholecalciferol  400 Units Oral BID  . gabapentin  300 mg Oral Q lunch  . gabapentin  600 mg Oral BID  . iron polysaccharides  150 mg Oral QODAY  . levothyroxine  75 mcg Oral QAC breakfast  . piperacillin-tazobactam (ZOSYN)  IV  3.375 g Intravenous Q8H  . tiotropium  18 mcg Inhalation Daily  . vancomycin  750 mg Intravenous Q24H  . warfarin  2.5 mg Oral ONCE-1800  . Warfarin - Pharmacist Dosing Inpatient   Does not apply q1800   Continuous Infusions:    Principal Problem:   Cellulitis Active Problems:   Atrial fibrillation   Gait instability   GERD (gastroesophageal reflux disease)   Hypothyroidism   Anemia   CHF (congestive heart failure)   Pulmonary edema   Acute on chronic diastolic heart failure    Time spent: 30 min    Borden Thune  [facility] Pager [phone]. If 7PM-7AM, please contact night-coverage at [email or web address], password [email or web address] 11/18/2012, 5:47 PM  LOS: 3 days

## 2012-11-19 ENCOUNTER — Inpatient Hospital Stay (HOSPITAL_COMMUNITY): Payer: Medicare Other

## 2012-11-19 DIAGNOSIS — I6350 Cerebral infarction due to unspecified occlusion or stenosis of unspecified cerebral artery: Secondary | ICD-10-CM

## 2012-11-19 DIAGNOSIS — J811 Chronic pulmonary edema: Secondary | ICD-10-CM

## 2012-11-19 DIAGNOSIS — I70209 Unspecified atherosclerosis of native arteries of extremities, unspecified extremity: Secondary | ICD-10-CM

## 2012-11-19 DIAGNOSIS — R1310 Dysphagia, unspecified: Secondary | ICD-10-CM

## 2012-11-19 DIAGNOSIS — J690 Pneumonitis due to inhalation of food and vomit: Secondary | ICD-10-CM

## 2012-11-19 LAB — VANCOMYCIN, TROUGH: Vancomycin Tr: 9.2 ug/mL — ABNORMAL LOW (ref 10.0–20.0)

## 2012-11-19 LAB — PROTIME-INR: INR: 3.21 — ABNORMAL HIGH (ref 0.00–1.49)

## 2012-11-19 LAB — BASIC METABOLIC PANEL
Calcium: 9.1 mg/dL (ref 8.4–10.5)
Creatinine, Ser: 0.9 mg/dL (ref 0.50–1.10)
GFR calc non Af Amer: 51 mL/min — ABNORMAL LOW (ref >90)
Sodium: 136 mEq/L (ref 135–145)

## 2012-11-19 LAB — PRO B NATRIURETIC PEPTIDE: Pro B Natriuretic peptide (BNP): 3312 pg/mL — ABNORMAL HIGH (ref 0–450)

## 2012-11-19 LAB — GLUCOSE, CAPILLARY
Glucose-Capillary: 98 mg/dL (ref 70–99)
Glucose-Capillary: 128 mg/dL — ABNORMAL HIGH (ref 70–99)

## 2012-11-19 MED ORDER — ADULT MULTIVITAMIN W/MINERALS CH
1.0000 | ORAL_TABLET | Freq: Every day | ORAL | Status: DC
  Administered 2012-11-19 – 2012-11-22 (×4): 1 via ORAL
  Filled 2012-11-19 (×4): qty 1

## 2012-11-19 MED ORDER — VANCOMYCIN HCL IN DEXTROSE 1-5 GM/200ML-% IV SOLN
1000.0000 mg | Freq: Every day | INTRAVENOUS | Status: DC
  Administered 2012-11-19: 1000 mg via INTRAVENOUS
  Filled 2012-11-19: qty 200

## 2012-11-19 MED ORDER — TRAMADOL HCL 50 MG PO TABS
100.0000 mg | ORAL_TABLET | Freq: Two times a day (BID) | ORAL | Status: DC
  Administered 2012-11-19 – 2012-11-20 (×3): 100 mg via ORAL
  Filled 2012-11-19 (×3): qty 2

## 2012-11-19 MED ORDER — ENSURE COMPLETE PO LIQD
237.0000 mL | Freq: Two times a day (BID) | ORAL | Status: DC
  Administered 2012-11-19 – 2012-11-22 (×7): 237 mL via ORAL

## 2012-11-19 MED ORDER — COENZYME Q10 200 MG PO CAPS
200.0000 mg | ORAL_CAPSULE | Freq: Every day | ORAL | Status: DC
  Administered 2012-11-19 – 2012-11-22 (×4): 200 mg via ORAL

## 2012-11-19 MED ORDER — FUROSEMIDE 10 MG/ML IJ SOLN
40.0000 mg | Freq: Every day | INTRAMUSCULAR | Status: DC
  Administered 2012-11-19 – 2012-11-22 (×4): 40 mg via INTRAVENOUS
  Filled 2012-11-19 (×4): qty 4

## 2012-11-19 NOTE — Evaluation (Signed)
Clinical/Bedside Swallow Evaluation Patient Details  Name: [name] MRN: [id number] Date of Birth: 10-11-1911  Today's Date: 11/19/2012 Time: 4098-1191 SLP Time Calculation (min): 37 min  Past Medical History:  Past Medical History  Diagnosis Date  . Chronic atrial fibrillation   . History of GI bleed   . Neuropathy   . Fatigue   . Palpitations   . SOB (shortness of breath)   . OA (osteoarthritis)   . Weakness   . Forgetfulness   . Anxiety   . Depression    Past Surgical History:  Past Surgical History  Procedure Laterality Date  . Umbilical hernia repair    . Appendectomy    . Knee surgery    . Tonsillectomy    . Vaginal hysterectomy    . US echocardiography  08/07/2002    EF 60-65%   HPI:  [name] is a 79 y.o. female with a Past Medical History of diastolic heart failure, atrial fibrillation still on chronic Coumadin therapy, hypothyroidism, history of CVA with mild residual dysarthria who presents today with the above noted complaint. She is a resident of a assisted living facility in [facility], she claims that that approximately 2 days back she noted that her right leg was swollen, and slightly erythematous, with a past 24 hours swelling and erythema have significantly worsened. It has also become significantly tender. She has also noticed that there is streaking up to her thighs. Patient and her daughter (at bedside) claimed that yesterday patient was not feeling well, a chest x-ray was done and there was some suggestion of pneumonia and she was started on Levaquin.   Assessment / Plan / Recommendation Clinical Impression  Pt. with change in respirations after drinking small sips of thin liquids (increased rate and expiratory wheeze noted, which was not present prior to po's).  Pt. c/o pain in her lower teeth when attempting to chew a saltine cracker.  RN reports her lower plate was lost at the ALF.  Only remaining lower teeth are in front, and  are broken and decayed.      Aspiration Risk  Mild    Diet Recommendation Dysphagia 1 (Puree);Nectar-thick liquid   Liquid Administration via: Cup;No straw Medication Administration: Whole meds with puree Supervision: Staff to assist with self feeding;Patient able to self feed;Full supervision/cueing for compensatory strategies Compensations: Slow rate;Small sips/bites Postural Changes and/or Swallow Maneuvers: Seated upright 90 degrees;Upright 30-60 min after meal    Other  Recommendations Oral Care Recommendations: Oral care BID Other Recommendations: Order thickener from pharmacy;Prohibited food (jello, ice cream, thin soups);Clarify dietary restrictions   Follow Up Recommendations  Skilled Nursing facility;24 hour supervision/assistance    Frequency and Duration min 2x/week  2 weeks   Pertinent Vitals/Pain CXR: Bilateral midial-basilar opasities, Atx. Vs. Infiltrate.    SLP Swallow Goals  See Care plan   Swallow Study Prior Functional Status       General HPI: [name] is a 79 y.o. female with a Past Medical History of diastolic heart failure, atrial fibrillation still on chronic Coumadin therapy, hypothyroidism, history of CVA with mild residual dysarthria who presents today with the above noted complaint. She is a resident of a assisted living facility in [facility], she claims that that approximately 2 days back she noted that her right leg was swollen, and slightly erythematous, with a past 24 hours swelling and erythema have significantly worsened. It has also become significantly tender. She has also noticed that there is streaking up  to her thighs. Patient and her daughter (at bedside) claimed that yesterday patient was not feeling well, a chest x-ray was done and there was some suggestion of pneumonia and she was started on Levaquin. Type of Study: Bedside swallow evaluation Previous Swallow Assessment: 01/31/10 Diet Prior to this Study: Dysphagia 1  (puree);Thin liquids Temperature Spikes Noted: No Respiratory Status: Room air History of Recent Intubation: No Behavior/Cognition: Alert;Cooperative;Confused;Distractible;Requires cueing;Decreased sustained attention Oral Cavity - Dentition: Poor condition;Missing dentition;Dentures, top (Lower partial plate was lost at ALF; c/o pain in lower teeth) Self-Feeding Abilities: Needs assist;Needs set up Patient Positioning: Upright in bed Baseline Vocal Quality: Clear Volitional Cough: Weak Volitional Swallow: Able to elicit    Oral/Motor/Sensory Function Overall Oral Motor/Sensory Function: Impaired at baseline Labial ROM: Reduced right Labial Symmetry: Abnormal symmetry right Labial Strength: Reduced Lingual ROM: Reduced right Lingual Symmetry: Abnormal symmetry right Lingual Strength: Reduced Facial ROM: Reduced right Facial Symmetry: Right droop Facial Strength: Reduced   Ice Chips Ice chips: Impaired Presentation: Spoon Pharyngeal Phase Impairments: Cough - Immediate   Thin Liquid Thin Liquid: Impaired Presentation: Spoon;Cup;Straw Oral Phase Impairments: Reduced labial seal;Reduced lingual movement/coordination Oral Phase Functional Implications: Prolonged oral transit Pharyngeal  Phase Impairments: Suspected delayed Swallow;Decreased hyoid-laryngeal movement (Audible swallows; change in respirations)    Nectar Thick Nectar Thick Liquid: Within functional limits Presentation: Cup   Honey Thick Honey Thick Liquid: Not tested   Puree Puree: Within functional limits Presentation: Spoon   Solid   GO    Solid: Impaired Presentation: Self Fed Oral Phase Impairments: Reduced labial seal;Impaired anterior to posterior transit Oral Phase Functional Implications: Oral residue       Maryjo Rochester T 11/19/2012,5:28 PM

## 2012-11-19 NOTE — Progress Notes (Signed)
ANTICOAGULATION CONSULT NOTE - Follow-up  Pharmacy Consult for Warfarin Indication: atrial fibrillation  Allergies  Allergen Reactions  . Cymbalta [Duloxetine Hcl]     Unknown reaction   Patient Measurements: Height: 5\' 2"  (157.5 cm) Weight: 151 lb 2.4 oz (68.56 kg) IBW/kg (Calculated) : 50.1  Labs:  Recent Labs  11/17/12 0512 11/18/12 0523 11/19/12 0505  HGB 9.4*  --   --   HCT 28.2*  --   --   PLT 149*  --   --   LABPROT 24.1* 25.4* 31.7*  INR 2.24* 2.40* 3.21*  CREATININE  --  0.99 0.90   Assessment: 79 yr female with a history of diastolic HF, CVA and AFib (on warfarin).  Complaint of right leg pain and swelling. MD with low clinical suspicion for VTE, dopplers not performed, antibiotics on board for cellulitis. Pharmacy asked to continue warfarin dosing upon admission  PTA patient on Warfarin 2.5mg  daily with last dose taken 11/15/12   INR above therapeutic range today at 3.21 this AM (trending upward with usual home dose)  Hgb 9.4 (11/9), trend low,stable   Plts increased to 149K (11.9) (baseline appears to be >250K)  No bleeding noted  INR may be affected by antibiotics  Pt with dysphagia 3 diet ordered, 50% of meals charted  Goal of Therapy:  INR 2-3   Plan:   No Warfarin today   Daily PT/INR   CBC at least q72h  Thank you for the consult.  Otho Bellows PharmD Pager [phone] 11/19/2012, 9:01 AM

## 2012-11-19 NOTE — Progress Notes (Signed)
Okay for patient to take CoQ10 200mg  daily.  Family to bring in bottle to be verified by pharmacist.

## 2012-11-19 NOTE — Progress Notes (Signed)
PHARMACY - BRIEF NOTE  45 yoF with RLE cellulitis, Vancomycin and Zosyn begun 11/7. SCr wnl, but clearance ~ 30 ml/min.  Day 5 abx, will measure Vancomycin trough today. Will aim for lower trough of 10-15 mcg/ml for cellulitis.  Vancomycin trough this evening = 9.62mcg/ml on 750mg  IV q24h (prior to 5th dose)  Goal of Therapy:  Vancomycin trough level 10-15 mcg/ml   Plan:  Vancomycin trough slightly below goal but based on MD note that there is no clinical improvement, will increase dose to 1gm IV q24h for est trough = 12.56mcg/ml  - Appears ABI results not likely cause of persistent cellulitis per d/w TRH and VVS   Juliette Alcide, PharmD, BCPS.   Pager: [phone] 11/19/2012 9:40 PM

## 2012-11-19 NOTE — Progress Notes (Signed)
ANTIBIOTIC CONSULT NOTE - FOLLOW UP  Pharmacy Consult for Vancomycin & Zosyn Indication: LE Cellulitis  Allergies  Allergen Reactions  . Cymbalta [Duloxetine Hcl]     Unknown reaction   Patient Measurements: Height: 5\' 2"  (157.5 cm) Weight: 151 lb 2.4 oz (68.56 kg) IBW/kg (Calculated) : 50.1  Vital Signs: Temp: 98 F (36.7 C) (11/11 0522) Temp src: Oral (11/11 0522) BP: 113/68 mmHg (11/11 0522) Pulse Rate: 76 (11/11 0522) Intake/Output from previous day: 11/10 0701 - 11/11 0700 In: 60 [P.O.:60] Out: 1 [Urine:1] Intake/Output from this shift: Total I/O In: 240 [P.O.:240] Out: -   Labs:  Recent Labs  11/17/12 0512 11/18/12 0523 11/19/12 0505  WBC 8.6  --   --   HGB 9.4*  --   --   PLT 149*  --   --   CREATININE  --  0.99 0.90   Estimated Creatinine Clearance: 30.9 ml/min (by C-G formula based on Cr of 0.9). No results found for this basename: VANCOTROUGH, Leodis Binet, VANCORANDOM, GENTTROUGH, GENTPEAK, GENTRANDOM, TOBRATROUGH, TOBRAPEAK, TOBRARND, AMIKACINPEAK, AMIKACINTROU, AMIKACIN,  in the last 72 hours   Microbiology: Recent Results (from the past 720 hour(s))  CULTURE, BLOOD (ROUTINE X 2)     Status: None   Collection Time    11/15/12  8:19 PM      Result Value Range Status   Specimen Description BLOOD LEFT ARM   Final   Special Requests BOTTLES DRAWN AEROBIC AND ANAEROBIC   Final   Culture  Setup Time     Final   Value: 11/16/2012 01:38     Performed at [facility]   Culture     Final   Value:        BLOOD CULTURE RECEIVED NO GROWTH TO DATE CULTURE WILL BE HELD FOR 5 DAYS BEFORE ISSUING A FINAL NEGATIVE REPORT     Performed at [facility]   Report Status PENDING   Incomplete  CULTURE, BLOOD (ROUTINE X 2)     Status: None   Collection Time    11/15/12  8:19 PM      Result Value Range Status   Specimen Description BLOOD RIGHT ARM   Final   Special Requests BOTTLES DRAWN AEROBIC AND ANAEROBIC   Final   Culture  Setup Time      Final   Value: 11/16/2012 01:39     Performed at [facility]   Culture     Final   Value:        BLOOD CULTURE RECEIVED NO GROWTH TO DATE CULTURE WILL BE HELD FOR 5 DAYS BEFORE ISSUING A FINAL NEGATIVE REPORT     Performed at [facility]   Report Status PENDING   Incomplete  MRSA PCR SCREENING     Status: None   Collection Time    11/15/12 11:15 PM      Result Value Range Status   MRSA by PCR NEGATIVE  NEGATIVE Final   Comment:            The GeneXpert MRSA Assay (FDA     approved for NASAL specimens     only), is one component of a     comprehensive MRSA colonization     surveillance program. It is not     intended to diagnose MRSA     infection nor to guide or     monitor treatment for     MRSA infections.    Anti-infectives   Start  Dose/Rate Route Frequency Ordered Stop   11/16/12 2200  vancomycin (VANCOCIN) IVPB 750 mg/150 ml premix     750 mg 150 mL/hr over 60 Minutes Intravenous Every 24 hours 11/15/12 2157     11/16/12 0400  piperacillin-tazobactam (ZOSYN) IVPB 3.375 g     3.375 g 12.5 mL/hr over 240 Minutes Intravenous Every 8 hours 11/15/12 2157     11/15/12 2030  piperacillin-tazobactam (ZOSYN) IVPB 3.375 g     3.375 g 100 mL/hr over 30 Minutes Intravenous  Once 11/15/12 2019 11/15/12 2112   11/15/12 2030  vancomycin (VANCOCIN) IVPB 1000 mg/200 mL premix     1,000 mg 200 mL/hr over 60 Minutes Intravenous  Once 11/15/12 2019 11/15/12 2224     Assessment: 89 yoF with RLE cellulitis, Vancomycin and Zosyn begun 11/7. SCr wnl, but clearance ~ 30 ml/min.  Day 5 abx, will measure Vancomycin trough today. Will aim for lower trough of 10-15 mcg/ml for cellulitis.   Goal of Therapy:  Vancomycin trough level 10-15 mcg/ml  Plan:   Vancomycin 750mg  q24  Zosyn 3.375gm q8hr- 4 hr infusion  Vancomycin trough tonight  Otho Bellows PharmD Pager [phone] 11/19/2012, 9:51 AM

## 2012-11-19 NOTE — Progress Notes (Signed)
CSW called [facility] assisted living. Spoke with Britta Mccreedy there. She states that they assist patient with her ADL's but that she was previously able to walk. If patient is able to ambulate some, they will be able to take her back and continue to assist her.  Chrystel Barefield C. Gretel Cantu MSW, LCSW [phone]

## 2012-11-19 NOTE — Progress Notes (Addendum)
[facility] PROGRESS NOTE  [name] XLK:[id number] DOB: [birth year] DOA: 11/15/2012 PCP: No primary provider on file.  Assessment/Plan  Right lower extremity cellulitis, no improvement again today -  Continue vancomycin and zosyn day 5 -  Continue lower extremity elevation  -   Right ABI 0.82 and monophasic, left > 1.  Spoke with Dr. Darrick Penna from vascular surgery who feels that this ABI is likely not the reason for her persistent cellulitis -  Increase tramadol -  If tramadol not working, will try scheduled oral morphine  Pulmonary edema on CXR may be secondary to acute on chronic diastolic heart failure, EF 50-55% and unable to determine diastolic heart failure due to a-fib, RV moderately dilated with mildly reduced function, severe TR, and blunted IVC changes.  -  Mildly improved changes on CXR and pro-BNP higher than before -  Lasix 40mg  IV today  Possible aspiration pneumonia and dysphagia -  Witnessed aspiration event today -  Speech therapy consult -  Change to dysphagia 1 diet for now  Generalized weakness, may be related to gabapentin, acute infection -  Per pharmacist, CrCl is 27 ml/min and maximum recommended dose is 700mg  per day.  -  Reduced to 400mg  BID and 200mg  at lunchtime yesterday, monitor clinically for signs of withdrawal, increasing pain  Fever, resolved.  CXR not definitely suggestive of HCAP or aspiration PNA.  May be related to cellulits -  Continue abx -  blood cultures NGTD  Dysphagia due to CVA  Gait instability, likely chronic and secondary to CVA.  PT evaluation recommending SNF  Hypothyroidism, stable.  Continue synthroid  Normocytic anemia, likely anemia of chronic disease and iron deficiency, hemoglobin at baseline.  Continue iron  Chronic atrial fibrillation, HR stable.  Continue warfarin  Anxiety and depression, stable.  Continue prn xanax    COPD, stable.  Continue spiriva with albuterol prn  Thrombocytopenia, may be due  to acute illness.  Repeat as outpatient  Diet:  Dysphagia 1 Access:  PIV IVF:  OFF Proph:  warfarin  Code Status: DNR Family Communication: patient and spoke with daughter this AM Disposition Plan: pending further clinical improvement in LE cellulitis.  PT recommending SNF.  Spoke with daughter about SNF this AM   Consultants:  None  Procedures:  CXR  XR right lower extremity  Antibiotics:  Vancomycin 11/7 >>  Zosyn 11/7 >>   HPI/Subjective:  Right leg still painful, red, swollen, and warm.  Not improved with ultram yesterday.  Persistent cough  Objective: Filed Vitals:   11/18/12 1346 11/18/12 2110 11/19/12 0522 11/19/12 0836  BP: 130/65 121/74 113/68   Pulse: 82 78 76   Temp: 98.6 F (37 C) 97.8 F (36.6 C) 98 F (36.7 C)   TempSrc: Oral Oral Oral   Resp: 20 18 20    Height:      Weight:   68.56 kg (151 lb 2.4 oz)   SpO2: 95% 95% 93% 93%    Intake/Output Summary (Last 24 hours) at 11/19/12 1335 Last data filed at 11/19/12 1251  Gross per 24 hour  Intake    520 ml  Output    276 ml  Net    244 ml   Filed Weights   11/17/12 0454 11/18/12 0519 11/19/12 0522  Weight: 69.5 kg (153 lb 3.5 oz) 67.4 kg (148 lb 9.4 oz) 68.56 kg (151 lb 2.4 oz)    Exam:   General:  CF, No acute distress, witness aspiration of breakfast this morning  HEENT:  NCAT, MMM  Cardiovascular:  RRR, nl S1, S2 no mrg, warm extremities  Respiratory:  Rhonchorous with exp wheeze, no focal rales, no increased WOB  Abdomen:   NABS, soft, NT/ND  MSK:   Normal tone and bulk, RLE warm to touch, pink erythema that has receded from previous line of demarcation to below the knee but stable for the last three days, TTP, 1+ pitting edema RLE, absent pedal pulse on the right  Neuro:  Grossly intact  Data Reviewed: Basic Metabolic Panel:  Recent Labs Lab 11/15/12 2019 11/16/12 0505 11/18/12 0523 11/19/12 0505  NA 135 136 136 136  K 3.8 3.6 3.6 3.6  CL 98 100 101 99  CO2 26 27 28  28   GLUCOSE 126* 105* 107* 108*  BUN 24* 22 21 22   CREATININE 1.02 1.03 0.99 0.90  CALCIUM 9.5 9.2 9.3 9.1   Liver Function Tests:  Recent Labs Lab 11/16/12 0505  AST 15  ALT 9  ALKPHOS 49  BILITOT 1.4*  PROT 6.3  ALBUMIN 2.9*   No results found for this basename: LIPASE, AMYLASE,  in the last 168 hours No results found for this basename: AMMONIA,  in the last 168 hours CBC:  Recent Labs Lab 11/15/12 2019 11/16/12 0505 11/17/12 0512  WBC 9.6 8.1 8.6  NEUTROABS 7.5  --   --   HGB 9.9* 9.5* 9.4*  HCT 29.5* 28.9* 28.2*  MCV 96.7 96.3 96.6  PLT 150 123* 149*   Cardiac Enzymes: No results found for this basename: CKTOTAL, CKMB, CKMBINDEX, TROPONINI,  in the last 168 hours BNP (last 3 results)  Recent Labs  11/16/12 0505 11/19/12 0505  PROBNP 2620.0* 3312.0*   CBG:  Recent Labs Lab 11/16/12 0816 11/17/12 0740 11/18/12 0734 11/19/12 0721  GLUCAP 101* 101* 105* 98    Recent Results (from the past 240 hour(s))  CULTURE, BLOOD (ROUTINE X 2)     Status: None   Collection Time    11/15/12  8:19 PM      Result Value Range Status   Specimen Description BLOOD LEFT ARM   Final   Special Requests BOTTLES DRAWN AEROBIC AND ANAEROBIC   Final   Culture  Setup Time     Final   Value: 11/16/2012 01:38     Performed at [facility]   Culture     Final   Value:        BLOOD CULTURE RECEIVED NO GROWTH TO DATE CULTURE WILL BE HELD FOR 5 DAYS BEFORE ISSUING A FINAL NEGATIVE REPORT     Performed at [facility]   Report Status PENDING   Incomplete  CULTURE, BLOOD (ROUTINE X 2)     Status: None   Collection Time    11/15/12  8:19 PM      Result Value Range Status   Specimen Description BLOOD RIGHT ARM   Final   Special Requests BOTTLES DRAWN AEROBIC AND ANAEROBIC   Final   Culture  Setup Time     Final   Value: 11/16/2012 01:39     Performed at [facility]   Culture     Final   Value:        BLOOD CULTURE RECEIVED NO GROWTH TO DATE  CULTURE WILL BE HELD FOR 5 DAYS BEFORE ISSUING A FINAL NEGATIVE REPORT     Performed at [facility]   Report Status PENDING   Incomplete  MRSA PCR SCREENING     Status: None  Collection Time    11/15/12 11:15 PM      Result Value Range Status   MRSA by PCR NEGATIVE  NEGATIVE Final   Comment:            The GeneXpert MRSA Assay (FDA     approved for NASAL specimens     only), is one component of a     comprehensive MRSA colonization     surveillance program. It is not     intended to diagnose MRSA     infection nor to guide or     monitor treatment for     MRSA infections.     Studies: Dg Chest Port 1 View  11/19/2012   CLINICAL DATA:  Evaluate pulmonary edema  EXAM: PORTABLE CHEST - 1 VIEW  COMPARISON:  11/15/2012; 06/09/2011; 12/24/2010  FINDINGS: Grossly unchanged enlarged cardiac silhouette and mediastinal contours with atherosclerotic plaque within a tortuous and possibly mildly ectatic thoracic aorta. The pulmonary vasculature remains indistinct with cephalization of flow, minimally improved in the interval. Grossly unchanged bilateral medial basilar heterogeneous/ consolidative opacities. Possible hiatal hernia. No evidence of edema. Grossly unchanged bones.  IMPRESSION: 1. Suspected minimally improved pulmonary edema with persistent findings of pulmonary venous congestion. 2. Grossly unchanged bilateral medial basilar opacities, atelectasis versus infiltrate. Further evaluation with a PA and lateral chest radiograph may be obtained as clinically indicated.   Electronically Signed   By: Simonne Come M.D.   On: 11/19/2012 08:07    Scheduled Meds: . calcium-vitamin D  1 tablet Oral BID  . cholecalciferol  400 Units Oral BID  . feeding supplement (ENSURE COMPLETE)  237 mL Oral BID BM  . furosemide  40 mg Intravenous Daily  . gabapentin  200 mg Oral Q lunch  . gabapentin  400 mg Oral BID  . iron polysaccharides  150 mg Oral QODAY  . levothyroxine  75 mcg Oral QAC  breakfast  . multivitamin with minerals  1 tablet Oral Daily  . piperacillin-tazobactam (ZOSYN)  IV  3.375 g Intravenous Q8H  . tiotropium  18 mcg Inhalation Daily  . traMADol  100 mg Oral Q12H  . vancomycin  750 mg Intravenous Q24H  . Warfarin - Pharmacist Dosing Inpatient   Does not apply q1800   Continuous Infusions:    Principal Problem:   Cellulitis Active Problems:   Atrial fibrillation   Gait instability   GERD (gastroesophageal reflux disease)   Hypothyroidism   Anemia   CHF (congestive heart failure)   Pulmonary edema   Acute on chronic diastolic heart failure    Time spent: 30 min    [name]  [facility] Pager [phone]. If 7PM-7AM, please contact night-coverage at [email or web address], password [facility] 11/19/2012, 1:35 PM  LOS: 4 days

## 2012-11-20 DIAGNOSIS — L0291 Cutaneous abscess, unspecified: Secondary | ICD-10-CM

## 2012-11-20 DIAGNOSIS — R5381 Other malaise: Secondary | ICD-10-CM

## 2012-11-20 LAB — CBC
MCH: 31.9 pg (ref 26.0–34.0)
Platelets: 198 10*3/uL (ref 150–400)
RBC: 2.98 MIL/uL — ABNORMAL LOW (ref 3.87–5.11)

## 2012-11-20 LAB — BASIC METABOLIC PANEL
Calcium: 9 mg/dL (ref 8.4–10.5)
Chloride: 96 mEq/L (ref 96–112)
GFR calc non Af Amer: 56 mL/min — ABNORMAL LOW (ref >90)
Glucose, Bld: 104 mg/dL — ABNORMAL HIGH (ref 70–99)
Potassium: 3.5 mEq/L (ref 3.5–5.1)
Sodium: 134 mEq/L — ABNORMAL LOW (ref 135–145)

## 2012-11-20 LAB — PROTIME-INR: INR: 2.4 — ABNORMAL HIGH (ref 0.00–1.49)

## 2012-11-20 LAB — GLUCOSE, CAPILLARY: Glucose-Capillary: 111 mg/dL — ABNORMAL HIGH (ref 70–99)

## 2012-11-20 MED ORDER — RESOURCE THICKENUP CLEAR PO POWD
ORAL | Status: DC | PRN
  Filled 2012-11-20: qty 125

## 2012-11-20 MED ORDER — WARFARIN SODIUM 2 MG PO TABS
2.0000 mg | ORAL_TABLET | Freq: Once | ORAL | Status: AC
  Administered 2012-11-20: 2 mg via ORAL
  Filled 2012-11-20: qty 1

## 2012-11-20 MED ORDER — DOXYCYCLINE HYCLATE 100 MG PO TABS
100.0000 mg | ORAL_TABLET | Freq: Two times a day (BID) | ORAL | Status: DC
  Administered 2012-11-20 – 2012-11-22 (×5): 100 mg via ORAL
  Filled 2012-11-20 (×7): qty 1

## 2012-11-20 NOTE — Progress Notes (Signed)
Patient appears drowsy, not interested in her visitors and or eating.  Seems to have beccome more lethargic after receiving her tramadol this am, discussed with her daughter and she asked that we discontinue the medication because it was something her mother was not use to taking.  Dr. Rhona Leavens notified

## 2012-11-20 NOTE — Progress Notes (Signed)
[facility] PROGRESS NOTE  Tyshawn Keel Hogate WJX:[id number] DOB: 1911-09-12 DOA: 11/15/2012 PCP: No primary provider on file.  Assessment/Plan: Right lower extremity cellulitis, no improvement again today  - Continue vancomycin and zosyn day 5  - Continue lower extremity elevation  - Right ABI 0.82 and monophasic, left > 1. Spoke with Dr. Darrick Penna from vascular surgery who feels that this ABI is likely not the reason for her persistent cellulitis  - Increase tramadol  - If tramadol not working, will try scheduled oral morphine  Pulmonary edema on CXR may be secondary to acute on chronic diastolic heart failure, EF 50-55% and unable to determine diastolic heart failure due to a-fib, RV moderately dilated with mildly reduced function, severe TR, and blunted IVC changes.  - Mildly improved changes on CXR and pro-BNP higher than before  - Lasix 40mg  IV today  Possible aspiration pneumonia and dysphagia  - Witnessed aspiration event today  - Speech therapy consult  - Change to dysphagia 1 diet for now  Generalized weakness, may be related to gabapentin, acute infection  - Per pharmacist, CrCl is 27 ml/min and maximum recommended dose is 700mg  per day.  - Reduced to 400mg  BID and 200mg  at lunchtime yesterday, monitor clinically for signs of withdrawal, increasing pain  Fever, resolved. CXR not definitely suggestive of HCAP or aspiration PNA. May be related to cellulits  - Continue abx  - blood cultures NGTD  Dysphagia due to CVA  Gait instability, likely chronic and secondary to CVA. PT evaluation recommending SNF  Hypothyroidism, stable. Continue synthroid  Normocytic anemia, likely anemia of chronic disease and iron deficiency, hemoglobin at baseline. Continue iron  Chronic atrial fibrillation, HR stable. Continue warfarin  Anxiety and depression, stable. Continue prn xanax  COPD, stable. Continue spiriva with albuterol prn  Thrombocytopenia, may be due to acute illness. Repeat as  outpatient  Code Status: DNR Family Communication: Pt in room (indicate person spoken with, relationship, and if by phone, the number) Disposition Plan: Pending   Antibiotics: Vancomycin 11/7 >>11/12  Zosyn 11/7 >> 11/12 Doxycycline 11/12>>>  HPI/Subjective: No acute events. Pt complains of R LE pain  Objective: Filed Vitals:   11/19/12 1355 11/19/12 2203 11/20/12 0612 11/20/12 0902  BP: 122/70 121/63 138/54   Pulse: 86 82 91   Temp: 98.2 F (36.8 C) 97.9 F (36.6 C) 98.4 F (36.9 C)   TempSrc: Oral Axillary Axillary   Resp: 18  20   Height:      Weight:   67.9 kg (149 lb 11.1 oz)   SpO2: 93% 94% 93% 96%    Intake/Output Summary (Last 24 hours) at 11/20/12 0946 Last data filed at 11/20/12 0616  Gross per 24 hour  Intake      0 ml  Output   1675 ml  Net  -1675 ml   Filed Weights   11/18/12 0519 11/19/12 0522 11/20/12 0612  Weight: 67.4 kg (148 lb 9.4 oz) 68.56 kg (151 lb 2.4 oz) 67.9 kg (149 lb 11.1 oz)    Exam:   General:  Awake, in nad  Cardiovascular: regular, s1, s2  Respiratory: normal resp effort, no wheezing  Abdomen: soft, nondistended  Musculoskeletal: perfused, no clubbing   Data Reviewed: Basic Metabolic Panel:  Recent Labs Lab 11/15/12 2019 11/16/12 0505 11/18/12 0523 11/19/12 0505 11/20/12 0520  NA 135 136 136 136 134*  K 3.8 3.6 3.6 3.6 3.5  CL 98 100 101 99 96  CO2 26 27 28 28 31   GLUCOSE 126*  105* 107* 108* 104*  BUN 24* 22 21 22 23   CREATININE 1.02 1.03 0.99 0.90 0.83  CALCIUM 9.5 9.2 9.3 9.1 9.0   Liver Function Tests:  Recent Labs Lab 11/16/12 0505  AST 15  ALT 9  ALKPHOS 49  BILITOT 1.4*  PROT 6.3  ALBUMIN 2.9*   No results found for this basename: LIPASE, AMYLASE,  in the last 168 hours No results found for this basename: AMMONIA,  in the last 168 hours CBC:  Recent Labs Lab 11/15/12 2019 11/16/12 0505 11/17/12 0512 11/20/12 0520  WBC 9.6 8.1 8.6 8.1  NEUTROABS 7.5  --   --   --   HGB 9.9* 9.5* 9.4*  9.5*  HCT 29.5* 28.9* 28.2* 28.4*  MCV 96.7 96.3 96.6 95.3  PLT 150 123* 149* 198   Cardiac Enzymes: No results found for this basename: CKTOTAL, CKMB, CKMBINDEX, TROPONINI,  in the last 168 hours BNP (last 3 results)  Recent Labs  11/16/12 0505 11/19/12 0505  PROBNP 2620.0* 3312.0*   CBG:  Recent Labs Lab 11/17/12 0740 11/18/12 0734 11/19/12 0721 11/19/12 2208 11/20/12 0814  GLUCAP 101* 105* 98 128* 111*    Recent Results (from the past 240 hour(s))  CULTURE, BLOOD (ROUTINE X 2)     Status: None   Collection Time    11/15/12  8:19 PM      Result Value Range Status   Specimen Description BLOOD LEFT ARM   Final   Special Requests BOTTLES DRAWN AEROBIC AND ANAEROBIC   Final   Culture  Setup Time     Final   Value: 11/16/2012 01:38     Performed at [facility]   Culture     Final   Value:        BLOOD CULTURE RECEIVED NO GROWTH TO DATE CULTURE WILL BE HELD FOR 5 DAYS BEFORE ISSUING A FINAL NEGATIVE REPORT     Performed at [facility]   Report Status PENDING   Incomplete  CULTURE, BLOOD (ROUTINE X 2)     Status: None   Collection Time    11/15/12  8:19 PM      Result Value Range Status   Specimen Description BLOOD RIGHT ARM   Final   Special Requests BOTTLES DRAWN AEROBIC AND ANAEROBIC   Final   Culture  Setup Time     Final   Value: 11/16/2012 01:39     Performed at [facility]   Culture     Final   Value:        BLOOD CULTURE RECEIVED NO GROWTH TO DATE CULTURE WILL BE HELD FOR 5 DAYS BEFORE ISSUING A FINAL NEGATIVE REPORT     Performed at [facility]   Report Status PENDING   Incomplete  MRSA PCR SCREENING     Status: None   Collection Time    11/15/12 11:15 PM      Result Value Range Status   MRSA by PCR NEGATIVE  NEGATIVE Final   Comment:            The GeneXpert MRSA Assay (FDA     approved for NASAL specimens     only), is one component of a     comprehensive MRSA colonization     surveillance program. It is  not     intended to diagnose MRSA     infection nor to guide or     monitor treatment for     MRSA  infections.     Studies: Dg Chest Port 1 View  11/19/2012   CLINICAL DATA:  Evaluate pulmonary edema  EXAM: PORTABLE CHEST - 1 VIEW  COMPARISON:  11/15/2012; 06/09/2011; 12/24/2010  FINDINGS: Grossly unchanged enlarged cardiac silhouette and mediastinal contours with atherosclerotic plaque within a tortuous and possibly mildly ectatic thoracic aorta. The pulmonary vasculature remains indistinct with cephalization of flow, minimally improved in the interval. Grossly unchanged bilateral medial basilar heterogeneous/ consolidative opacities. Possible hiatal hernia. No evidence of edema. Grossly unchanged bones.  IMPRESSION: 1. Suspected minimally improved pulmonary edema with persistent findings of pulmonary venous congestion. 2. Grossly unchanged bilateral medial basilar opacities, atelectasis versus infiltrate. Further evaluation with a PA and lateral chest radiograph may be obtained as clinically indicated.   Electronically Signed   By: Simonne Come M.D.   On: 11/19/2012 08:07    Scheduled Meds: . calcium-vitamin D  1 tablet Oral BID  . cholecalciferol  400 Units Oral BID  . Coenzyme Q10  200 mg Oral Daily  . feeding supplement (ENSURE COMPLETE)  237 mL Oral BID BM  . furosemide  40 mg Intravenous Daily  . gabapentin  200 mg Oral Q lunch  . gabapentin  400 mg Oral BID  . iron polysaccharides  150 mg Oral QODAY  . levothyroxine  75 mcg Oral QAC breakfast  . multivitamin with minerals  1 tablet Oral Daily  . piperacillin-tazobactam (ZOSYN)  IV  3.375 g Intravenous Q8H  . tiotropium  18 mcg Inhalation Daily  . traMADol  100 mg Oral Q12H  . vancomycin  1,000 mg Intravenous QHS  . warfarin  2 mg Oral ONCE-1800  . Warfarin - Pharmacist Dosing Inpatient   Does not apply q1800   Continuous Infusions:   Principal Problem:   Cellulitis Active Problems:   Atrial fibrillation   Gait  instability   GERD (gastroesophageal reflux disease)   Hypothyroidism   Anemia   CHF (congestive heart failure)   Pulmonary edema   Acute on chronic diastolic heart failure   Dysphagia, unspecified(787.20)   Aspiration pneumonia   Atherosclerotic peripheral vascular disease  Time spent:  CHIU, STEPHEN K  [facility] Pager [phone]. If 7PM-7AM, please contact night-coverage at [email or web address], password [facility] 11/20/2012, 9:46 AM  LOS: 5 days

## 2012-11-20 NOTE — Progress Notes (Signed)
Spoke with patient's daughter regarding update on mothers condition.

## 2012-11-20 NOTE — Progress Notes (Signed)
ANTICOAGULATION CONSULT NOTE - Follow-up  Pharmacy Consult for Warfarin Indication: atrial fibrillation  Allergies  Allergen Reactions  . Cymbalta [Duloxetine Hcl]     Unknown reaction   Patient Measurements: Height: 5\' 2"  (157.5 cm) Weight: 149 lb 11.1 oz (67.9 kg) IBW/kg (Calculated) : 50.1  Labs:  Recent Labs  11/18/12 0523 11/19/12 0505 11/20/12 0520  HGB  --   --  9.5*  HCT  --   --  28.4*  PLT  --   --  198  LABPROT 25.4* 31.7* 25.4*  INR 2.40* 3.21* 2.40*  CREATININE 0.99 0.90 0.83   Assessment: 79 yr female with a history of diastolic HF, CVA and AFib (on warfarin).  Complaint of right leg pain and swelling. MD with low clinical suspicion for VTE, dopplers not performed, antibiotics on board for cellulitis. Pharmacy asked to continue warfarin dosing upon admission  PTA patient on Warfarin 2.5mg  daily with last dose taken 11/15/12   INR today = 2.40, back within range after holding dose yesterday  Hgb 9.5, low,stable, plts WNL  No bleeding noted  INR may be affected by antibiotics  Pt with dysphagia 1 diet, 50% of meals charted on 11/11  Goal of Therapy:  INR 2-3   Plan:   Warfarin 2mg  po x 1 @ 1800  Daily PT/INR   CBC at least q72h  Thank you for the consult.  Haynes Hoehn, PharmD 11/20/2012, 7:50 AM  Pager: [phone]

## 2012-11-20 NOTE — Progress Notes (Signed)
Speech Language Pathology Treatment: Dysphagia  Patient Details Name: [name] MRN: [id number] DOB: 01-10-1911 Today's Date: 11/20/2012 Time: 0865-7846 SLP Time Calculation (min): 23 min  Assessment / Plan / Recommendation Clinical Impression  Pt sen to assess tolerance of po diet, need for further diet modification.  Per nurse tech and RN, pt tolerating po well without coughing.  Pt appeared sleepy with this SLP but participated during session.  Provided pt with Ensure drink and applesauce - swallow was mildly delayed but no overt s/s of aspiration.  Pt did become mildly dyspneic with wheeze after minimal amount of intake *two sips Ensure, one bite applesauce.  RN reports she had just recently eaten.    Aspiration risk will be chronic due to weakness, lethargy and cardiac issue/respiratory status.  Rec continue modified diet to mitigate risk.  SLP ordered can of thickener for Rn to use.   Further recommend pt have follow up SLP at next venue of care for treatment.  Question if pt had dysphagia after previous CVA with exacerbation from weakness/heart failure.  SLP educated pt to recommendations.  No family present currently, will follow up for education of family.      HPI HPI: [name] is a 79 y.o. female with a Past Medical History of diastolic heart failure, atrial fibrillation still on chronic Coumadin therapy, hypothyroidism, history of CVA with mild residual dysarthria who presents today with the above noted complaint. She is a resident of a assisted living facility in [facility], she claims that that approximately 2 days back she noted that her right leg was swollen, and slightly erythematous, with a past 24 hours swelling and erythema have significantly worsened. It has also become significantly tender. She has also noticed that there is streaking up to her thighs. Patient and her daughter (at bedside) claimed that yesterday patient was not feeling well, a chest x-ray  was done and there was some suggestion of pneumonia and she was started on Levaquin.   Pertinent Vitals Afebrile, decreased  SLP Plan  Continue with current plan of care    Recommendations Diet recommendations: Dysphagia 1 (puree);Nectar-thick liquid Liquids provided via: Cup;No straw Medication Administration: Whole meds with puree Supervision: Staff to assist with self feeding;Full supervision/cueing for compensatory strategies;Patient able to self feed Compensations: Slow rate;Small sips/bites (rest break if short of breath or coughing) Postural Changes and/or Swallow Maneuvers: Seated upright 90 degrees;Upright 30-60 min after meal              Oral Care Recommendations: Oral care BID Follow up Recommendations: Skilled Nursing facility;24 hour supervision/assistance Plan: Continue with current plan of care    GO     Donavan Burnet, MS [facility] SLP [phone]

## 2012-11-21 LAB — BASIC METABOLIC PANEL
BUN: 31 mg/dL — ABNORMAL HIGH (ref 6–23)
CO2: 31 mEq/L (ref 19–32)
Calcium: 9.6 mg/dL (ref 8.4–10.5)
Chloride: 97 mEq/L (ref 96–112)
Creatinine, Ser: 0.9 mg/dL (ref 0.50–1.10)
GFR calc non Af Amer: 51 mL/min — ABNORMAL LOW (ref >90)
Glucose, Bld: 119 mg/dL — ABNORMAL HIGH (ref 70–99)

## 2012-11-21 LAB — CBC WITH DIFFERENTIAL/PLATELET
Basophils Absolute: 0 10*3/uL (ref 0.0–0.1)
Basophils Relative: 0 % (ref 0–1)
Eosinophils Relative: 0 % (ref 0–5)
HCT: 28.6 % — ABNORMAL LOW (ref 36.0–46.0)
MCH: 32.3 pg (ref 26.0–34.0)
MCHC: 33.9 g/dL (ref 30.0–36.0)
MCV: 95.3 fL (ref 78.0–100.0)
Monocytes Absolute: 1 10*3/uL (ref 0.1–1.0)
Neutro Abs: 7.9 10*3/uL — ABNORMAL HIGH (ref 1.7–7.7)
Platelets: 245 10*3/uL (ref 150–400)
RDW: 15.4 % (ref 11.5–15.5)
WBC: 10.4 10*3/uL (ref 4.0–10.5)

## 2012-11-21 LAB — GLUCOSE, CAPILLARY: Glucose-Capillary: 124 mg/dL — ABNORMAL HIGH (ref 70–99)

## 2012-11-21 LAB — PROTIME-INR
INR: 1.98 — ABNORMAL HIGH (ref 0.00–1.49)
Prothrombin Time: 21.9 seconds — ABNORMAL HIGH (ref 11.6–15.2)

## 2012-11-21 MED ORDER — GABAPENTIN 100 MG PO CAPS
200.0000 mg | ORAL_CAPSULE | Freq: Three times a day (TID) | ORAL | Status: DC
  Administered 2012-11-21 – 2012-11-22 (×4): 200 mg via ORAL
  Filled 2012-11-21 (×5): qty 2

## 2012-11-21 MED ORDER — WARFARIN SODIUM 2 MG PO TABS
2.0000 mg | ORAL_TABLET | Freq: Once | ORAL | Status: AC
  Administered 2012-11-21: 2 mg via ORAL
  Filled 2012-11-21: qty 1

## 2012-11-21 NOTE — Progress Notes (Signed)
Physical Therapy Treatment Patient Details Name: MOANI WEIPERT MRN: [id number] DOB: 1911/03/26 Today's Date: 11/21/2012 Time: 5784-6962 PT Time Calculation (min): 24 min  PT Assessment / Plan / Recommendation  History of Present Illness  79 y.o. female with a Past Medical History of diastolic heart failure, atrial fibrillation still on chronic Coumadin therapy, hypothyroidism, history of CVA with mild residual dysarthria who presents today with the above noted complaint. She is a resident of a assisted living facility in [facility], she claims that that approximately 2 days back she noted that her right leg was swollen, and slightly erythematous, with a past 24 hours swelling and erythema have significantly worsened. It has also become significantly tender. She has also noticed that there is streaking up to her thighs.   PT Comments   Pt progressing slowly with her mobility and requires + 2 total assist to amb 5 feet.    Follow Up Recommendations  SNF (will need SNF level before returning to ALF due to poor amb status)     Does the patient have the potential to tolerate intense rehabilitation     Barriers to Discharge        Equipment Recommendations       Recommendations for Other Services    Frequency Min 3X/week   Progress towards PT Goals Progress towards PT goals: Progressing toward goals  Plan      Precautions / Restrictions Precautions Precautions: Fall Precaution Comments: R LE cellulitis/pain Restrictions Weight Bearing Restrictions: No Other Position/Activity Restrictions: WBAT   Pertinent Vitals/Pain C/o R LE pain but unable to rate    Mobility  Bed Mobility Bed Mobility: Not assessed Details for Bed Mobility Assistance: Pt OOB in recliner Transfers Transfers: Sit to Stand;Stand to Sit Sit to Stand: 1: +2 Total assist;From chair/3-in-1 Sit to Stand: Patient Percentage: 30% Stand to Sit: 1: +2 Total assist;To chair/3-in-1 Stand to Sit: Patient  Percentage: 30% Details for Transfer Assistance: 75% VC's for instruction and proper hand plcement to increase self rise from recliner. Pt demon decrease active use R UE and great difficulty self rising from seat.   Ambulation/Gait Ambulation/Gait Assistance: 1: +2 Total assist Ambulation/Gait: Patient Percentage: 30% Ambulation Distance (Feet): 5 Feet Assistive device: Rolling walker Ambulation/Gait Assistance Details: Pt required total assist + 2 for amb with 3rd assist following with recliner.  Pt required 75% VC's to increase posture and steplength as demon decrease WB tolerance thru R LE and poor forward flex posture.  Pt also demon increased anxiety repeating "I can't walk without mywalker".  RN staff reports  per daughter pt amb by supporting self thru B elbows on walker.  So padded our walker to allow her to do the same.   Gait Pattern: Step-through pattern;Decreased stride length;Trunk flexed Gait velocity: decreased     PT Goals (current goals can now be found in the care plan section)    Visit Information  Last PT Received On: 11/21/12 Assistance Needed: +2 History of Present Illness:  79 y.o. female with a Past Medical History of diastolic heart failure, atrial fibrillation still on chronic Coumadin therapy, hypothyroidism, history of CVA with mild residual dysarthria who presents today with the above noted complaint. She is a resident of a assisted living facility in [facility], she claims that that approximately 2 days back she noted that her right leg was swollen, and slightly erythematous, with a past 24 hours swelling and erythema have significantly worsened. It has also become significantly tender. She has also noticed that  there is streaking up to her thighs.    Subjective Data      Cognition       Balance     End of Session PT - End of Session Equipment Utilized During Treatment: Gait belt Activity Tolerance: Patient limited by fatigue;Patient limited by  pain Patient left: in chair;with call bell/phone within reach   Felecia Shelling  PTA [facility]  Acute  Rehab Pager      [phone]

## 2012-11-21 NOTE — Progress Notes (Addendum)
CSW continuing to follow for disposition needs.   CSW reviewed chart and noted that pt is from [facility] and pt is hopeful to return there. Per CSW note, 11/19/12 facility stated that if pt is able to ambulate some then facility would be able to accept her back and continue to assist her. CSW reviewed PT note and noted that pt was unable to ambulate on 11/10 due to R LE pain. Therefore, CSW attempting to confirm that facility feels they can meet pt needs or if pt will require SNF placement.  CSW attempted to contact pt daughter via telephone and left voice message.  CSW contacted [facility] and left message with resident care coordinator.  CSW to continue to follow to assist with disposition planning.   Addendum 10:50am:  CSW received return phone call from [facility]. Per facility resident care coordinator, resident care coordinator received phone call from pt daughter yesterday stating that pt would go to a SNF at discharge from hospital.  CSW to continue to try to reach pt daughter in order to determine if pt daughter has received bed offers and has decision for SNF.  Addendum 3:37 pm:  CSW received return phone call from pt daughter, Lupita Leash. CSW discussed with pt daughter re: disposition planning. Pt daughter agrees that pt will need SNF placement and is concerned that pt may require long term at SNF. CSW reviewed bed offers and chose [facility] as pt had been to facility in the past.   CSW contacted [facility] and left voice message to notify of acceptance of bed offer.   CSW to continue to follow and facilitate pt discharge needs when pt medically ready for discharge.  Jacklynn Lewis, MSW, LCSWA (coverage for Becky Sax, [place]) Clinical Social Work [phone]

## 2012-11-21 NOTE — Progress Notes (Signed)
Patient given mouth care, dentures replaced, moved from bed to chair by staff she participated very little.  Breakfast ordered.

## 2012-11-21 NOTE — Progress Notes (Signed)
Spoke with patient's daughter giving an update on her mothers condition this morning.  She verbalized her concern over her mother's deconditioning stating she was more active before coming to hospital, also that she normally wears a supportive brace on her right knee

## 2012-11-21 NOTE — Progress Notes (Signed)
[facility] PROGRESS NOTE  Caliyah Sieh Beeghly ZOX:[id number] DOB: September 28, 1911 DOA: 11/15/2012 PCP: No primary provider on file.  Assessment/Plan: Right lower extremity cellulitis, no improvement again today  - Continue vancomycin and zosyn day 5  - Continue lower extremity elevation  - Right ABI 0.82 and monophasic, left > 1. Spoke with Dr. Darrick Penna from vascular surgery who feels that this ABI is likely not the reason for her persistent cellulitis  - Increase tramadol  - If tramadol not working, will try scheduled oral morphine  Pulmonary edema on CXR may be secondary to acute on chronic diastolic heart failure,  -EF 50-55% and unable to determine diastolic heart failure due to a-fib, RV moderately dilated with mildly reduced function, severe TR, and blunted IVC changes.  - Mildly improved changes on CXR and pro-BNP higher than before  - Lasix 40mg  IV today  Possible aspiration pneumonia and dysphagia  - Witnessed aspiration event recently - Speech therapy consult  - Change to dysphagia 1 diet for now  Generalized weakness, may be related to gabapentin, acute infection  - Suspect possible overmedication - Initially reduced to 400mg  BID and 200mg  at lunchtime yesterday, monitor clinically for signs of withdrawal, increasing pain - Will further decrease to 200mg  TID Fever, resolved. CXR not definitely suggestive of HCAP or aspiration PNA. May be related to cellulits  - Continue abx  - blood cultures NGTD  Dysphagia due to CVA  Gait instability, likely chronic and secondary to CVA. PT evaluation recommending SNF  Hypothyroidism, stable. Continue synthroid  Normocytic anemia, likely anemia of chronic disease and iron deficiency, hemoglobin at baseline. Continue iron  Chronic atrial fibrillation, HR stable. Continue warfarin  Anxiety and depression, stable. Continue prn xanax  COPD, stable. Continue spiriva with albuterol prn  Thrombocytopenia, may be due to acute illness. Repeat  as outpatient  Code Status: DNR Family Communication: Pt in room (indicate person spoken with, relationship, and if by phone, the number) Disposition Plan: Pending  Antibiotics: Vancomycin 11/7 >>11/12  Zosyn 11/7 >> 11/12 Doxycycline 11/12>>>  HPI/Subjective: No acute events. Pt complains of R LE pain  Objective: Filed Vitals:   11/20/12 2109 11/21/12 0513 11/21/12 0811 11/21/12 1037  BP: 135/70 147/72    Pulse:      Temp: 98.2 F (36.8 C) 99.8 F (37.7 C) 98.9 F (37.2 C)   TempSrc: Oral Oral Oral   Resp: 20 20    Height:      Weight:  65.6 kg (144 lb 10 oz)    SpO2: 93% 91% 93% 93%    Intake/Output Summary (Last 24 hours) at 11/21/12 1054 Last data filed at 11/21/12 0900  Gross per 24 hour  Intake    410 ml  Output   1951 ml  Net  -1541 ml   Filed Weights   11/19/12 0522 11/20/12 0612 11/21/12 0513  Weight: 68.56 kg (151 lb 2.4 oz) 67.9 kg (149 lb 11.1 oz) 65.6 kg (144 lb 10 oz)    Exam:   General:  Awake, in nad  Cardiovascular: regular, s1, s2  Respiratory: normal resp effort, no wheezing  Abdomen: soft, nondistended  Musculoskeletal: perfused, no clubbing   Data Reviewed: Basic Metabolic Panel:  Recent Labs Lab 11/16/12 0505 11/18/12 0523 11/19/12 0505 11/20/12 0520 11/21/12 0544  NA 136 136 136 134* 137  K 3.6 3.6 3.6 3.5 4.0  CL 100 101 99 96 97  CO2 27 28 28 31 31   GLUCOSE 105* 107* 108* 104* 119*  BUN 22 21  22 23 31*  CREATININE 1.03 0.99 0.90 0.83 0.90  CALCIUM 9.2 9.3 9.1 9.0 9.6   Liver Function Tests:  Recent Labs Lab 11/16/12 0505  AST 15  ALT 9  ALKPHOS 49  BILITOT 1.4*  PROT 6.3  ALBUMIN 2.9*   No results found for this basename: LIPASE, AMYLASE,  in the last 168 hours No results found for this basename: AMMONIA,  in the last 168 hours CBC:  Recent Labs Lab 11/15/12 2019 11/16/12 0505 11/17/12 0512 11/20/12 0520 11/21/12 0544  WBC 9.6 8.1 8.6 8.1 10.4  NEUTROABS 7.5  --   --   --  7.9*  HGB 9.9* 9.5*  9.4* 9.5* 9.7*  HCT 29.5* 28.9* 28.2* 28.4* 28.6*  MCV 96.7 96.3 96.6 95.3 95.3  PLT 150 123* 149* 198 245   Cardiac Enzymes: No results found for this basename: CKTOTAL, CKMB, CKMBINDEX, TROPONINI,  in the last 168 hours BNP (last 3 results)  Recent Labs  11/16/12 0505 11/19/12 0505  PROBNP 2620.0* 3312.0*   CBG:  Recent Labs Lab 11/19/12 0721 11/19/12 2208 11/20/12 0814 11/20/12 1123 11/21/12 0728  GLUCAP 98 128* 111* 214* 124*    Recent Results (from the past 240 hour(s))  CULTURE, BLOOD (ROUTINE X 2)     Status: None   Collection Time    11/15/12  8:19 PM      Result Value Range Status   Specimen Description BLOOD LEFT ARM   Final   Special Requests BOTTLES DRAWN AEROBIC AND ANAEROBIC   Final   Culture  Setup Time     Final   Value: 11/16/2012 01:38     Performed at [facility]   Culture     Final   Value:        BLOOD CULTURE RECEIVED NO GROWTH TO DATE CULTURE WILL BE HELD FOR 5 DAYS BEFORE ISSUING A FINAL NEGATIVE REPORT     Performed at [facility]   Report Status PENDING   Incomplete  CULTURE, BLOOD (ROUTINE X 2)     Status: None   Collection Time    11/15/12  8:19 PM      Result Value Range Status   Specimen Description BLOOD RIGHT ARM   Final   Special Requests BOTTLES DRAWN AEROBIC AND ANAEROBIC   Final   Culture  Setup Time     Final   Value: 11/16/2012 01:39     Performed at [facility]   Culture     Final   Value:        BLOOD CULTURE RECEIVED NO GROWTH TO DATE CULTURE WILL BE HELD FOR 5 DAYS BEFORE ISSUING A FINAL NEGATIVE REPORT     Performed at [facility]   Report Status PENDING   Incomplete  MRSA PCR SCREENING     Status: None   Collection Time    11/15/12 11:15 PM      Result Value Range Status   MRSA by PCR NEGATIVE  NEGATIVE Final   Comment:            The GeneXpert MRSA Assay (FDA     approved for NASAL specimens     only), is one component of a     comprehensive MRSA colonization      surveillance program. It is not     intended to diagnose MRSA     infection nor to guide or     monitor treatment for     MRSA  infections.     Studies: No results found.  Scheduled Meds: . calcium-vitamin D  1 tablet Oral BID  . cholecalciferol  400 Units Oral BID  . Coenzyme Q10  200 mg Oral Daily  . doxycycline  100 mg Oral BID  . feeding supplement (ENSURE COMPLETE)  237 mL Oral BID BM  . furosemide  40 mg Intravenous Daily  . gabapentin  200 mg Oral Q lunch  . gabapentin  400 mg Oral BID  . iron polysaccharides  150 mg Oral QODAY  . levothyroxine  75 mcg Oral QAC breakfast  . multivitamin with minerals  1 tablet Oral Daily  . tiotropium  18 mcg Inhalation Daily  . warfarin  2 mg Oral ONCE-1800  . Warfarin - Pharmacist Dosing Inpatient   Does not apply q1800   Continuous Infusions:   Principal Problem:   Cellulitis Active Problems:   Atrial fibrillation   Gait instability   GERD (gastroesophageal reflux disease)   Hypothyroidism   Anemia   CHF (congestive heart failure)   Pulmonary edema   Acute on chronic diastolic heart failure   Dysphagia, unspecified(787.20)   Aspiration pneumonia   Atherosclerotic peripheral vascular disease  Time spent:  Yamil Oelke K  [facility] Pager [phone]. If 7PM-7AM, please contact night-coverage at [email or web address], password [facility] 11/21/2012, 10:54 AM  LOS: 6 days

## 2012-11-21 NOTE — Progress Notes (Signed)
ANTICOAGULATION CONSULT NOTE - Follow-up  Pharmacy Consult for Warfarin Indication: atrial fibrillation  Allergies  Allergen Reactions  . Cymbalta [Duloxetine Hcl]     Unknown reaction   Patient Measurements: Height: 5\' 2"  (157.5 cm) Weight: 144 lb 10 oz (65.6 kg) IBW/kg (Calculated) : 50.1  Labs:  Recent Labs  11/19/12 0505 11/20/12 0520 11/21/12 0544  HGB  --  9.5* 9.7*  HCT  --  28.4* 28.6*  PLT  --  198 245  LABPROT 31.7* 25.4* 21.9*  INR 3.21* 2.40* 1.98*  CREATININE 0.90 0.83 0.90   Assessment: 79 yr female with a history of diastolic HF, CVA and AFib (on warfarin).  Complaint of right leg pain and swelling. MD with low clinical suspicion for VTE, dopplers not performed, antibiotics on board for cellulitis. Pharmacy asked to continue warfarin dosing upon admission  PTA patient on Warfarin 2.5mg  daily with last dose taken 11/15/12   INR today = 1.98, essentially therapeutic.   Hgb 9.7, low,stable, plts WNL  No bleeding noted  INR may be affected by antibiotics - changed to PO doxycycline 11/12. Will repeat lower dose warfarin due to interaction.    Pt with dysphagia 1 diet  Goal of Therapy:  INR 2-3   Plan:   Warfarin 2mg  po x 1 @ 1800  Daily PT/INR   CBC at least q72h  Thank you for the consult.  Haynes Hoehn, PharmD 11/21/2012, 8:33 AM  Pager: [phone]

## 2012-11-21 NOTE — Progress Notes (Signed)
Speech Language Pathology Treatment: Dysphagia  Patient Details Name: [name] MRN: [id number] DOB: 11-Apr-1911 Today's Date: 11/21/2012 Time: 7829-5621 SLP Time Calculation (min): 11 min  Assessment / Plan / Recommendation Clinical Impression  Pt seen to assess tolerance of po diet today, especially after yesterday pt demonstrated increased work of breathing.  Per RN, changes in medications have been made in effort to improve mental status.  Pt alert today and breathing appears better.   Better tolerance of po reported today by nurse technician/nurse and per observation.  Wet voice concerning for aspiration of secretions noted prior to po that cleared with cued throat clear/cough.   When SLP informed pt of concerns regarding breathing yesterday, pt admitted to premorbid issues with breathing at times- ? Due to heart failure.    Provided pt with icecream via tsp x3 and nectar thickened juice and water- no overt s/s of aspiration nor indications of laryngeal penetration apparent.  Swallow was more timely and speech was less dysarthric- suspect premorbid CVA impacts pt's swallow/speech.     Rec to continue modified diet with strict aspiration precautions to mitigate aspiration risk in this 79 yo pt.  Follow up with SLP at SNF recommended for dysphagia management and to determine readiness for dietary advancement.    HPI HPI: [name] is a 79 y.o. female with a Past Medical History of diastolic heart failure, atrial fibrillation still on chronic Coumadin therapy, hypothyroidism, history of CVA with mild residual dysarthria who presents today with the above noted complaint. She is a resident of a assisted living facility in [facility], she claims that that approximately 2 days back she noted that her right leg was swollen, and slightly erythematous, with a past 24 hours swelling and erythema have significantly worsened. It has also become significantly tender. She has also  noticed that there is streaking up to her thighs. Patient and her daughter (at bedside) claimed that yesterday patient was not feeling well, a chest x-ray was done and there was some suggestion of pneumonia and she was started on Levaquin.  Bedside swallow evaluation with diet changed to puree/nectar due to concerns for aspiration.  SLP follow up to assess tolerance of po, during yesterday's session pt demonstrated increased Work of breathing with intake.     Pertinent Vitals Afebrile, decreased  SLP Plan  Continue with current plan of care    Recommendations Diet recommendations: Dysphagia 1 (puree);Nectar-thick liquid Liquids provided via: Cup;No straw Medication Administration: Whole meds with puree Supervision: Full supervision/cueing for compensatory strategies;Staff to assist with self feeding Compensations: Slow rate;Small sips/bites (clear throat/cough if voice is wet)              Oral Care Recommendations: Oral care BID Follow up Recommendations: Skilled Nursing facility;24 hour supervision/assistance Plan: Continue with current plan of care    GO     Mills Koller, MS [facility] SLP [phone]

## 2012-11-22 DIAGNOSIS — R5381 Other malaise: Secondary | ICD-10-CM

## 2012-11-22 LAB — CBC WITH DIFFERENTIAL/PLATELET
Basophils Absolute: 0 10*3/uL (ref 0.0–0.1)
Eosinophils Relative: 1 % (ref 0–5)
HCT: 28.6 % — ABNORMAL LOW (ref 36.0–46.0)
Hemoglobin: 9.6 g/dL — ABNORMAL LOW (ref 12.0–15.0)
Lymphocytes Relative: 16 % (ref 12–46)
Lymphs Abs: 1.6 10*3/uL (ref 0.7–4.0)
MCHC: 33.6 g/dL (ref 30.0–36.0)
MCV: 96.6 fL (ref 78.0–100.0)
Monocytes Absolute: 0.7 10*3/uL (ref 0.1–1.0)
Monocytes Relative: 7 % (ref 3–12)
Neutrophils Relative %: 76 % (ref 43–77)
RDW: 15.7 % — ABNORMAL HIGH (ref 11.5–15.5)
WBC: 10.1 10*3/uL (ref 4.0–10.5)

## 2012-11-22 LAB — BASIC METABOLIC PANEL
CO2: 34 mEq/L — ABNORMAL HIGH (ref 19–32)
Calcium: 9.6 mg/dL (ref 8.4–10.5)
Chloride: 100 mEq/L (ref 96–112)
Creatinine, Ser: 0.75 mg/dL (ref 0.50–1.10)
GFR calc Af Amer: 79 mL/min — ABNORMAL LOW (ref >90)
Glucose, Bld: 112 mg/dL — ABNORMAL HIGH (ref 70–99)

## 2012-11-22 LAB — CULTURE, BLOOD (ROUTINE X 2): Culture: NO GROWTH

## 2012-11-22 MED ORDER — GABAPENTIN 300 MG PO CAPS: 300.0000 mg | ORAL_CAPSULE | Freq: Three times a day (TID) | ORAL | Status: DC

## 2012-11-22 MED ORDER — WARFARIN SODIUM 2 MG PO TABS
2.0000 mg | ORAL_TABLET | Freq: Once | ORAL | Status: DC
  Filled 2012-11-22: qty 1

## 2012-11-22 MED ORDER — DOXYCYCLINE HYCLATE 100 MG PO TABS: 100.0000 mg | ORAL_TABLET | Freq: Two times a day (BID) | ORAL | Status: DC

## 2012-11-22 NOTE — Discharge Summary (Signed)
Physician Discharge Summary  [name] HYQ:[id number] DOB: October 27, 1911 DOA: 11/15/2012  PCP: No primary provider on file.  Admit date: 11/15/2012 Discharge date: 11/22/2012  Time spent: 35 minutes  Recommendations for Outpatient Follow-up:  1. Follow up with PCP in 1-2 weeks 2. Repeat INR in 1 week  Discharge Diagnoses:  Principal Problem:   Cellulitis Active Problems:   Atrial fibrillation   Gait instability   GERD (gastroesophageal reflux disease)   Hypothyroidism   Anemia   CHF (congestive heart failure)   Pulmonary edema   Acute on chronic diastolic heart failure   Dysphagia, unspecified(787.20)   Aspiration pneumonia   Atherosclerotic peripheral vascular disease   Discharge Condition: Improved  Diet recommendation: Dysphagia I diet with nectar thick liquids, meds whole in applesauce (crush if needed) with full supervision to follow aspiration and reflux precautiions, remain upright 30-75min after meals  Filed Weights   11/20/12 0612 11/21/12 0513 11/22/12 0510  Weight: 67.9 kg (149 lb 11.1 oz) 65.6 kg (144 lb 10 oz) 67.4 kg (148 lb 9.4 oz)    History of present illness:  [name] is a 79 y.o. female with a Past Medical History of diastolic heart failure, atrial fibrillation still on chronic Coumadin therapy, hypothyroidism, history of CVA with mild residual dysarthria who presents today with the above noted complaint. She is a resident of a assisted living facility in [facility], she claims that that approximately 2 days back she noted that her right leg was swollen, and slightly erythematous, with a past 24 hours swelling and erythema have significantly worsened. It has also become significantly tender. She has also noticed that there is streaking up to her thighs. Patient and her daughter (at bedside) claimed that yesterday patient was not feeling well, a chest x-ray was done and there was some suggestion of pneumonia and she was started on  Levaquin. However, when the assisted living staff saw her later today, she was transferred to the emergency room for further evaluation. I was asked to admit this patient for further evaluation and treatment.  There is no history of headache, chest pain, shortness of breath, nausea, vomiting or diarrhea. She denies any abdominal pain.  Hospital Course:  Right lower extremity cellulitis, no improvement again today  - Was initially continued on vancomycin and zosyn  - Continued lower extremity elevation  - Right ABI 0.82 and monophasic, left > 1. Spoke with Dr. Darrick Penna from vascular surgery who feels that this ABI is likely not the reason for her persistent cellulitis  - Abx were transitioned to doxycycline PO with continued improvement Pulmonary edema on CXR may be secondary to acute on chronic diastolic heart failure, EF 50-55% and unable to determine diastolic heart failure due to a-fib, RV moderately dilated with mildly reduced function, severe TR, and blunted IVC changes.  - Mildly improved changes on CXR and pro-BNP higher than before  Possible aspiration pneumonia and dysphagia  - Witnessed aspiration event during this hospitalization - Speech therapy consulted - Recommendations for dysphagia 1 diet - see above Generalized weakness, may be related to gabapentin, acute infection  - Per pharmacist, CrCl is 27 ml/min and maximum recommended dose is 700mg  per day.  - Initially reduced to 400mg  BID and 200mg  at lunchtime, then further decreased to 200mg  TID secondary to increased sedation  Fever, resolved. CXR not definitely suggestive of HCAP or aspiration PNA. May be related to cellulits  - blood cultures NGTD  Dysphagia due to CVA  Gait instability, likely chronic  and secondary to CVA. PT evaluation recommending SNF  Hypothyroidism, stable. Continue synthroid  Normocytic anemia, likely anemia of chronic disease and iron deficiency, hemoglobin at baseline. Continue iron  Chronic atrial  fibrillation, HR stable. Continue warfarin  Anxiety and depression, stable. Continue prn xanax  COPD, stable. Continue spiriva with albuterol prn  Thrombocytopenia, may be due to acute illness. Repeat as outpatient  Antibiotics: Vancomycin 11/7 >>11/12  Zosyn 11/7 >> 11/12  Doxycycline 11/12>>>  Discharge Exam: Filed Vitals:   11/21/12 1419 11/21/12 2104 11/22/12 0510 11/22/12 0934  BP: 144/76 151/56 152/79   Pulse: 90 93 82   Temp: 98 F (36.7 C) 98.4 F (36.9 C) 98.8 F (37.1 C)   TempSrc: Oral Oral Oral   Resp: 19 20 20    Height:      Weight:   67.4 kg (148 lb 9.4 oz)   SpO2: 94% 96% 96% 100%    General: Awake, in nad Cardiovascular: regular, s1, s2 Respiratory: normal resp effort, no wheezing  Discharge Instructions     Medication List    STOP taking these medications       levofloxacin 500 MG tablet  Commonly known as:  LEVAQUIN      TAKE these medications       ALPRAZolam 0.25 MG tablet  Commonly known as:  XANAX  Take 0.125-0.25 mg by mouth every 6 (six) hours as needed. For anxiety     calcium-vitamin D 500-200 MG-UNIT per tablet  Commonly known as:  OSCAL WITH D  Take 1 tablet by mouth 2 (two) times daily.     CoQ10 100 MG Caps  Take 100 mg/day by mouth 1 day or 1 dose.     doxycycline 100 MG tablet  Commonly known as:  VIBRA-TABS  Take 1 tablet (100 mg total) by mouth 2 (two) times daily.     gabapentin 300 MG capsule  Commonly known as:  NEURONTIN  Take 1 capsule (300 mg total) by mouth 3 (three) times daily.     HYDROcodone-acetaminophen 5-325 MG per tablet  Commonly known as:  NORCO/VICODIN  Take 1-2 tablets by mouth every 8 (eight) hours as needed. For mild to moderate pain     levothyroxine 75 MCG tablet  Commonly known as:  SYNTHROID, LEVOTHROID  Take 75 mcg by mouth daily.     omega-3 acid ethyl esters 1 G capsule  Commonly known as:  LOVAZA  Take 1 capsule (1 g total) by mouth every evening.     polyethylene glycol packet   Commonly known as:  MIRALAX / GLYCOLAX  Take 17 g by mouth daily as needed. For constipation     polysaccharide iron 150 MG capsule  Generic drug:  iron polysaccharides  Take 150 mg by mouth every other day.     potassium chloride 8 MEQ tablet  Commonly known as:  KLOR-CON  Take 1 tablet (8 mEq total) by mouth 2 (two) times daily.     tiotropium 18 MCG inhalation capsule  Commonly known as:  SPIRIVA  Place 18 mcg into inhaler and inhale daily.     Vitamin D (Cholecalciferol) 400 UNITS Chew  Chew 1 tablet (400 Units total) by mouth 2 (two) times daily.     warfarin 2.5 MG tablet  Commonly known as:  COUMADIN  Take 2.5 mg by mouth daily.       Allergies  Allergen Reactions  . Cymbalta [Duloxetine Hcl]     Unknown reaction   Follow-up Information   Follow  up with Follow up with PCP in 1-2 weeks.       The results of significant diagnostics from this hospitalization (including imaging, microbiology, ancillary and laboratory) are listed below for reference.    Significant Diagnostic Studies: Dg Chest Port 1 View  11/19/2012   CLINICAL DATA:  Evaluate pulmonary edema  EXAM: PORTABLE CHEST - 1 VIEW  COMPARISON:  11/15/2012; 06/09/2011; 12/24/2010  FINDINGS: Grossly unchanged enlarged cardiac silhouette and mediastinal contours with atherosclerotic plaque within a tortuous and possibly mildly ectatic thoracic aorta. The pulmonary vasculature remains indistinct with cephalization of flow, minimally improved in the interval. Grossly unchanged bilateral medial basilar heterogeneous/ consolidative opacities. Possible hiatal hernia. No evidence of edema. Grossly unchanged bones.  IMPRESSION: 1. Suspected minimally improved pulmonary edema with persistent findings of pulmonary venous congestion. 2. Grossly unchanged bilateral medial basilar opacities, atelectasis versus infiltrate. Further evaluation with a PA and lateral chest radiograph may be obtained as clinically indicated.    Electronically Signed   By: Simonne Come M.D.   On: 11/19/2012 08:07   Dg Chest Port 1 View  11/16/2012   CLINICAL DATA:  Shortness of breath.  EXAM: PORTABLE CHEST - 1 VIEW  COMPARISON:  Chest radiograph performed 06/09/2011  FINDINGS: The lungs are relatively well expanded. Vascular congestion is noted, with mildly increased interstitial markings, raising concern for mild pulmonary edema. No definite pleural effusion or pneumothorax is seen.  The cardiomediastinal silhouette is enlarged. An apparent moderate to large hiatal hernia is seen. No acute osseous abnormalities are identified.  IMPRESSION: 1. Vascular congestion and cardiomegaly, with mildly increased interstitial markings, raising concern for mild pulmonary edema. 2. Moderate to large hiatal hernia seen.   Electronically Signed   By: Roanna Raider M.D.   On: 11/16/2012 01:38   Dg Tibia/fibula Right Port  11/15/2012   CLINICAL DATA:  Right leg swelling  EXAM: PORTABLE RIGHT TIBIA AND FIBULA - 2 VIEW  COMPARISON:  None.  FINDINGS: Two views of the right tibia-fibula submitted. No acute fracture or subluxation. Osteoarthritic changes are noted right knee joint. Diffuse osteopenia.  IMPRESSION: No acute fracture or subluxation. Diffuse osteopenia. Osteoarthritic changes right knee joint.   Electronically Signed   By: Natasha Mead M.D.   On: 11/15/2012 20:48    Microbiology: Recent Results (from the past 240 hour(s))  CULTURE, BLOOD (ROUTINE X 2)     Status: None   Collection Time    11/15/12  8:19 PM      Result Value Range Status   Specimen Description BLOOD LEFT ARM   Final   Special Requests BOTTLES DRAWN AEROBIC AND ANAEROBIC   Final   Culture  Setup Time     Final   Value: 11/16/2012 01:38     Performed at [facility]   Culture     Final   Value: NO GROWTH 5 DAYS     Performed at [facility]   Report Status 11/22/2012 FINAL   Final  CULTURE, BLOOD (ROUTINE X 2)     Status: None   Collection Time    11/15/12   8:19 PM      Result Value Range Status   Specimen Description BLOOD RIGHT ARM   Final   Special Requests BOTTLES DRAWN AEROBIC AND ANAEROBIC   Final   Culture  Setup Time     Final   Value: 11/16/2012 01:39     Performed at [facility]   Culture     Final  Value: NO GROWTH 5 DAYS     Performed at [facility]   Report Status 11/22/2012 FINAL   Final  MRSA PCR SCREENING     Status: None   Collection Time    11/15/12 11:15 PM      Result Value Range Status   MRSA by PCR NEGATIVE  NEGATIVE Final   Comment:            The GeneXpert MRSA Assay (FDA     approved for NASAL specimens     only), is one component of a     comprehensive MRSA colonization     surveillance program. It is not     intended to diagnose MRSA     infection nor to guide or     monitor treatment for     MRSA infections.     Labs: Basic Metabolic Panel:  Recent Labs Lab 11/18/12 0523 11/19/12 0505 11/20/12 0520 11/21/12 0544 11/22/12 0520  NA 136 136 134* 137 141  K 3.6 3.6 3.5 4.0 3.6  CL 101 99 96 97 100  CO2 28 28 31 31  34*  GLUCOSE 107* 108* 104* 119* 112*  BUN 21 22 23  31* 33*  CREATININE 0.99 0.90 0.83 0.90 0.75  CALCIUM 9.3 9.1 9.0 9.6 9.6   Liver Function Tests:  Recent Labs Lab 11/16/12 0505  AST 15  ALT 9  ALKPHOS 49  BILITOT 1.4*  PROT 6.3  ALBUMIN 2.9*   No results found for this basename: LIPASE, AMYLASE,  in the last 168 hours No results found for this basename: AMMONIA,  in the last 168 hours CBC:  Recent Labs Lab 11/15/12 2019 11/16/12 0505 11/17/12 0512 11/20/12 0520 11/21/12 0544 11/22/12 0520  WBC 9.6 8.1 8.6 8.1 10.4 10.1  NEUTROABS 7.5  --   --   --  7.9* 7.7  HGB 9.9* 9.5* 9.4* 9.5* 9.7* 9.6*  HCT 29.5* 28.9* 28.2* 28.4* 28.6* 28.6*  MCV 96.7 96.3 96.6 95.3 95.3 96.6  PLT 150 123* 149* 198 245 258   Cardiac Enzymes: No results found for this basename: CKTOTAL, CKMB, CKMBINDEX, TROPONINI,  in the last 168 hours BNP: BNP (last 3  results)  Recent Labs  11/16/12 0505 11/19/12 0505  PROBNP 2620.0* 3312.0*   CBG:  Recent Labs Lab 11/19/12 2208 11/20/12 0814 11/20/12 1123 11/21/12 0728 11/22/12 0734  GLUCAP 128* 111* 214* 124* 107*    Signed:  CHIU, STEPHEN K  [facility] 11/22/2012, 12:51 PM

## 2012-11-22 NOTE — Progress Notes (Signed)
Gave report to Boyd Kerbs, Charity fundraiser at [facility].  Left number if she had additional questions.

## 2012-11-22 NOTE — Progress Notes (Signed)
CSW checked pt pasarr and noted that pt pasarr had expired.  CSW resubmitted pt pasarr and awaiting pasarr review by pasarr RN.   CSW to facilitate pt discharge needs when pasarr received.  Jacklynn Lewis, MSW, LCSWA  Clinical Social Work [phone]

## 2012-11-22 NOTE — Progress Notes (Addendum)
CSW confirmed with [facility] that facility is able to accept pt when medically stable for discharge.  CSW awaiting MD to round on pt to determine if pt medically stable for discharge today.  CSW to facilitate pt discharge needs to [facility] when pt medically ready for discharge.  Jacklynn Lewis, MSW, LCSWA  Clinical Social Work [phone]

## 2012-11-22 NOTE — Progress Notes (Signed)
ANTICOAGULATION CONSULT NOTE - Follow-up  Pharmacy Consult for Warfarin Indication: atrial fibrillation  Allergies  Allergen Reactions  . Cymbalta [Duloxetine Hcl]     Unknown reaction   Patient Measurements: Height: 5\' 2"  (157.5 cm) Weight: 148 lb 9.4 oz (67.4 kg) IBW/kg (Calculated) : 50.1  Labs:  Recent Labs  11/20/12 0520 11/21/12 0544 11/22/12 0520  HGB 9.5* 9.7* 9.6*  HCT 28.4* 28.6* 28.6*  PLT 198 245 258  LABPROT 25.4* 21.9* 22.5*  INR 2.40* 1.98* 2.05*  CREATININE 0.83 0.90 0.75   Assessment: 79 yr female with a history of diastolic HF, CVA and AFib (on warfarin).  Complaint of right leg pain and swelling. MD with low clinical suspicion for VTE, dopplers not performed, antibiotics on board for cellulitis. Pharmacy asked to continue warfarin dosing upon admission  PTA patient on Warfarin 2.5mg  daily with last dose taken 11/15/12   INR today = 2.05  Hgb 9.6, low,stable, plts WNL  No bleeding noted  INR may be affected by antibiotics - changed to PO doxycycline 11/12. Will repeat lower dose warfarin due to interaction.    Pt with dysphagia 1 diet  Goal of Therapy:  INR 2-3   Plan:   Warfarin 2mg  po x 1 @ 1800  Repeat 2mg  dose tonight based on INR trend when resumed home dose of 2.5mg  earlier this admission. Continue to monitor INR trend now that antibiotics narrowed.   Daily PT/INR   CBC at least q72h  Juliette Alcide, PharmD, BCPS.   Pager: [phone] 11/22/2012 9:45 AM

## 2012-11-22 NOTE — Progress Notes (Signed)
Clinical Social Work Department CLINICAL SOCIAL WORK PLACEMENT NOTE 11/22/2012  Patient:  [name], [name]  Account Number:  [id number] Admit date:  11/15/2012  Clinical Social Worker:  Jacelyn Grip  Date/time:  11/20/2012 09:00 AM  Clinical Social Work is seeking post-discharge placement for this patient at the following level of care:   SKILLED NURSING   (*CSW will update this form in Epic as items are completed)   11/20/2012  Patient/family provided with [facility] Department of Clinical Social Work's list of facilities offering this level of care within the geographic area requested by the patient (or if unable, by the patient's family).  11/20/2012  Patient/family informed of their freedom to choose among providers that offer the needed level of care, that participate in Medicare, Medicaid or managed care program needed by the patient, have an available bed and are willing to accept the patient.  11/20/2012  Patient/family informed of [facility]' ownership interest in [facility], as well as of the fact that they are under no obligation to receive care at this facility.  PASARR submitted to [facility] on 11/22/2012 PASARR number received from [employer] on 11/22/2012  FL2 transmitted to all facilities in geographic area requested by pt/family on  11/20/2012 FL2 transmitted to all facilities within larger geographic area on   Patient informed that his/her managed care company has contracts with or will negotiate with  certain facilities, including the following:     Patient/family informed of bed offers received:  11/21/2012 Patient chooses bed at [facility], [place] Physician recommends and patient chooses bed at    [facility] [place] on  11/22/2012 Patient to be transferred to facility by ambulance Sharin Mons)  The following physician request were entered in Epic:   Additional  Comments:    Jacklynn Lewis, MSW, LCSWA  Clinical Social Work [phone]

## 2012-11-22 NOTE — Progress Notes (Signed)
CSW received pasarr number.  CSW facilitated pt discharge needs including contacting facility, faxing pt discharge information via [facility], discussing with pt at bedside and pt daughter via telephone, providing RN phone number to call report, and arranging ambulance transportation for pt to [facility].   No further social work needs identified at this time.  CSW signing off.  Jacklynn Lewis, MSW, LCSWA  Clinical Social Work [phone]

## 2012-12-17 IMAGING — CT CT HEAD W/O CM
1 series · 16 of 30 positions shown, 20 images · non-contrast
Comparison: 09/23/2007

CLINICAL DATA: Acute altered mental status with decreased level of
consciousness today.  Expressive the fascia, facial droop, and
right-sided weakness.

CT HEAD WITHOUT CONTRAST
TECHNIQUE: Contiguous axial images were obtained from the base of
the skull through the vertex without contrast.

[Series 2: head routine 4.8 h37s · axial · 0.43mm/px · z∈[-134,+11]mm · 16 of 34 slices shown, 20 images]
[im 2/34  brain]
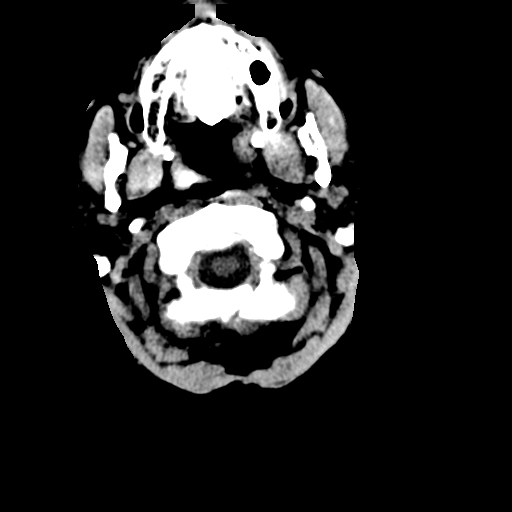
[im 2/34  bone]
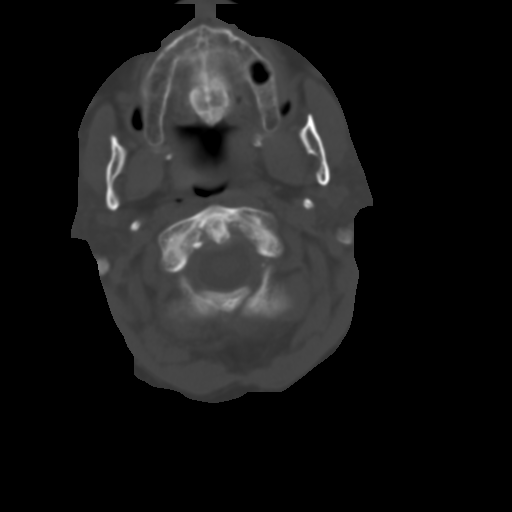
[im 4/34  brain]
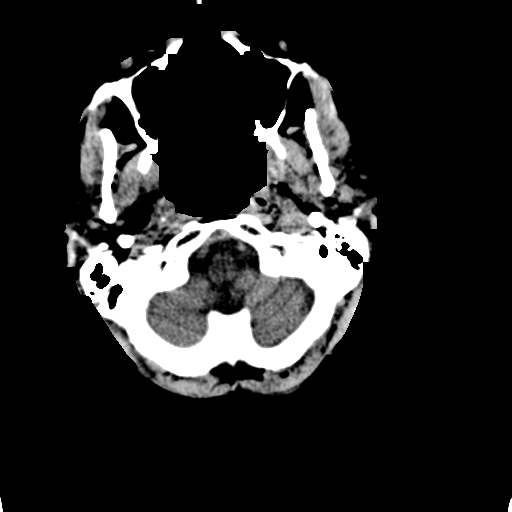
[im 6/34  brain]
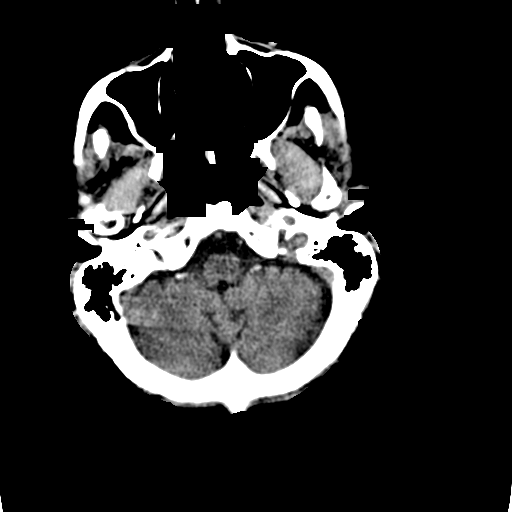
[im 8/34  brain]
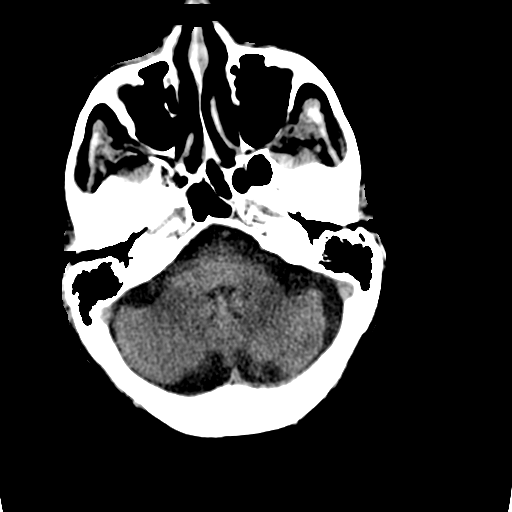
[im 10/34  brain]
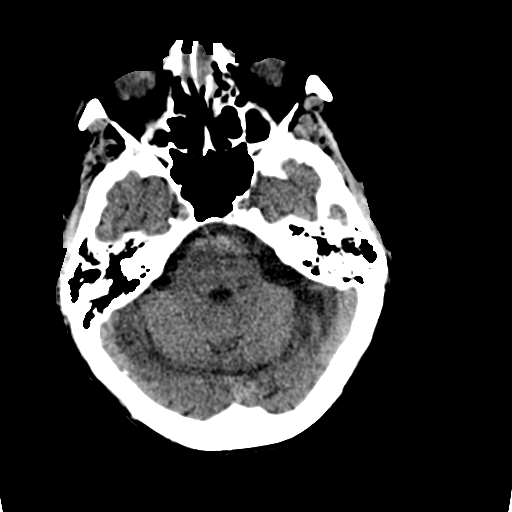
[im 10/34  bone]
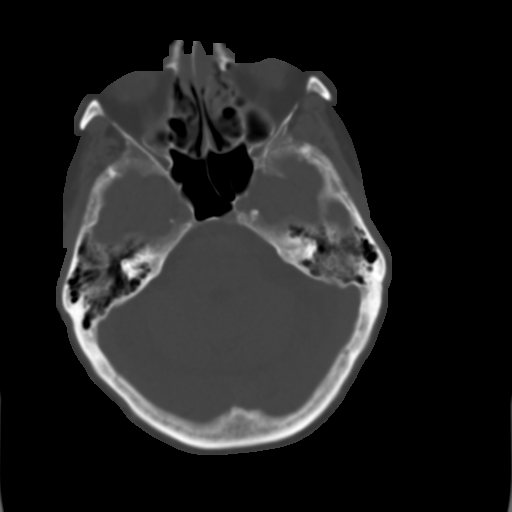
[im 12/34  brain]
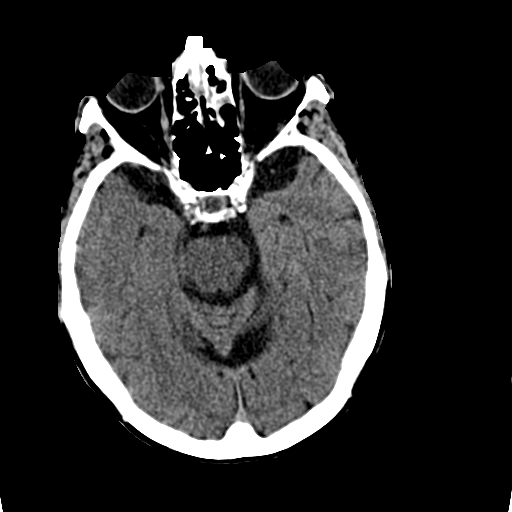
[im 14/34  brain]
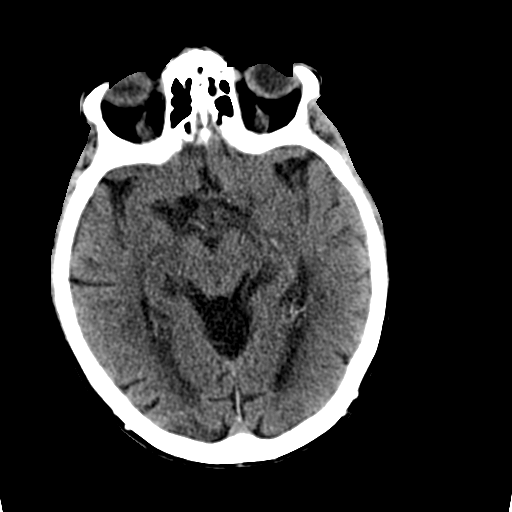
[im 16/34  brain]
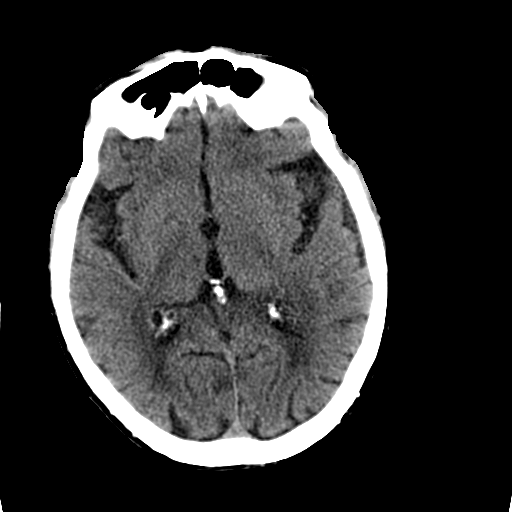
[im 18/34  brain]
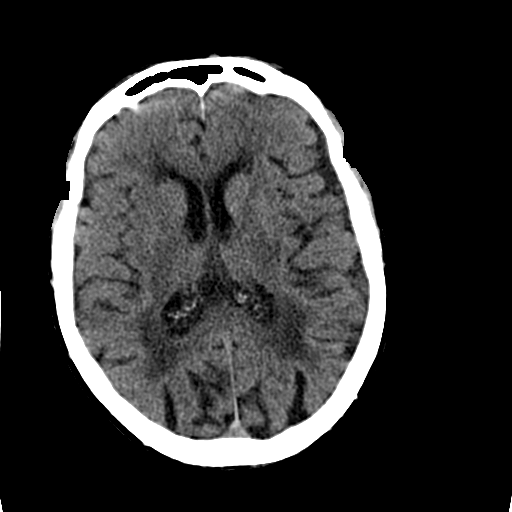
[im 18/34  bone]
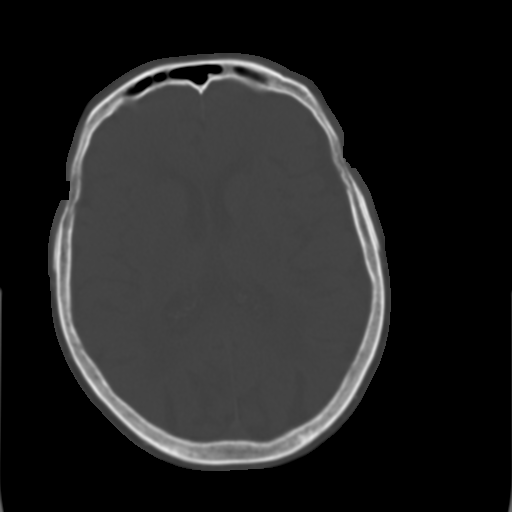
[im 20/34  brain]
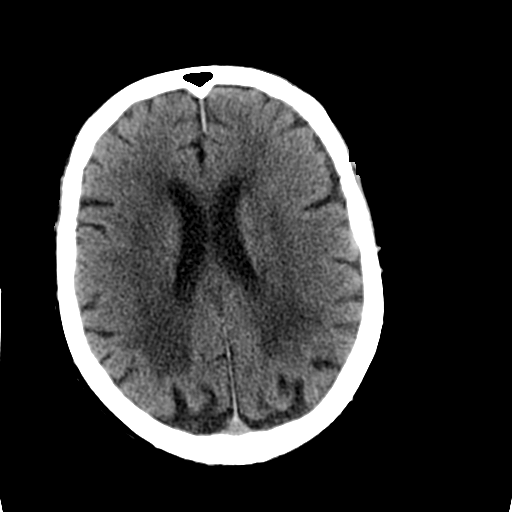
[im 22/34  brain]
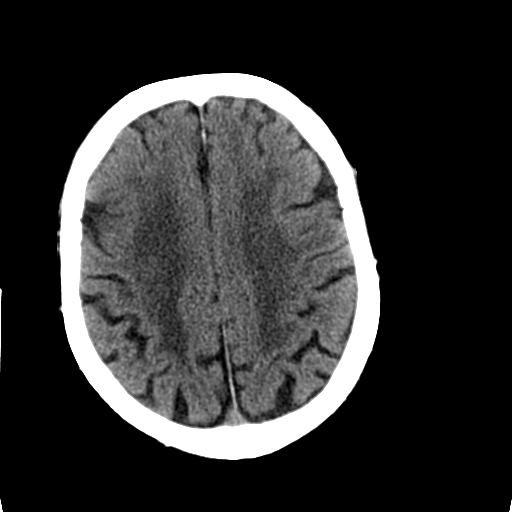
[im 24/34  brain]
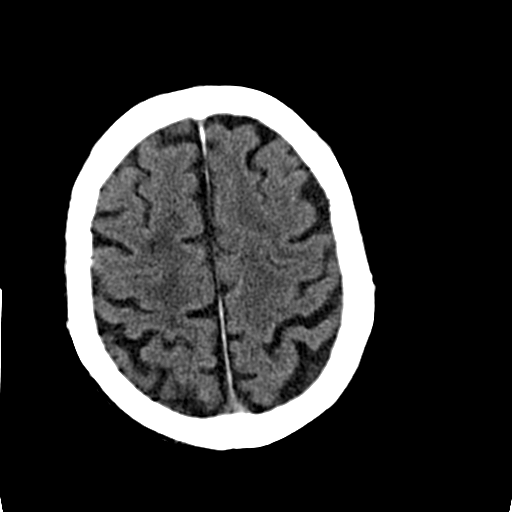
[im 26/34  brain]
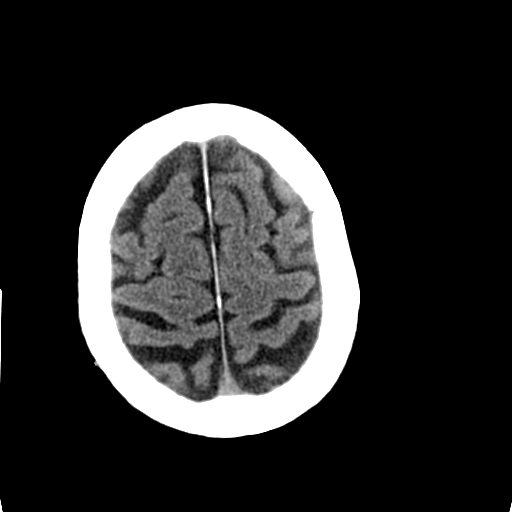
[im 26/34  bone]
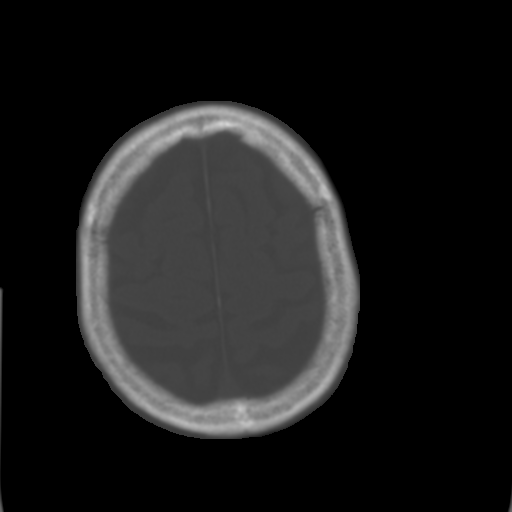
[im 28/34  brain]
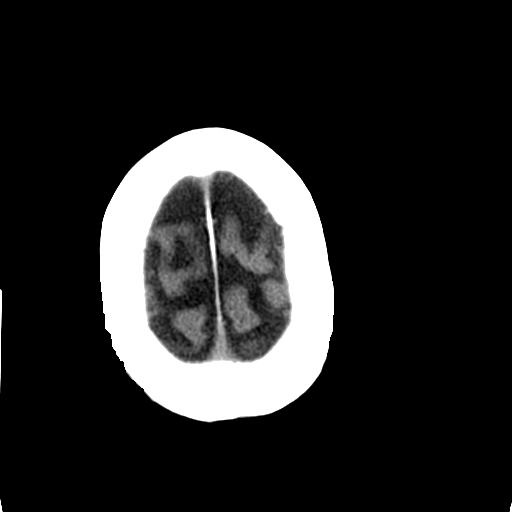
[im 30/34  brain]
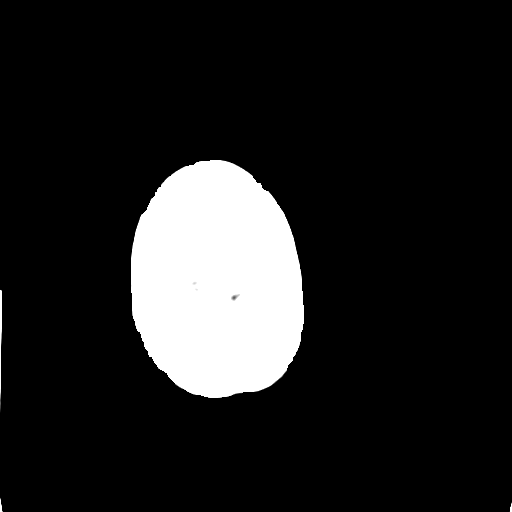
[im 32/34  brain]
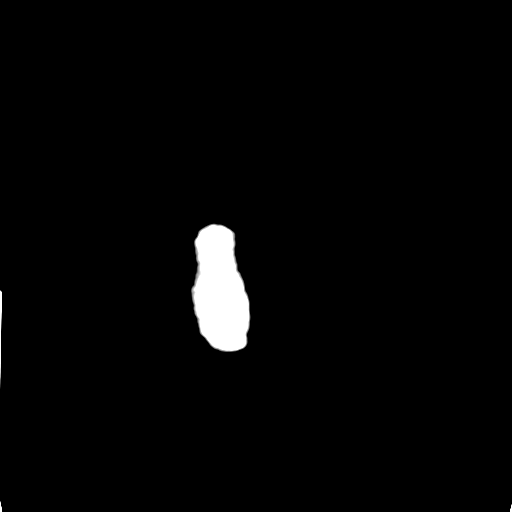

[16 of 30 positions shown; findings below may reference images not displayed]

FINDINGS: There is no acute intracranial infarction, hemorrhage, or
mass lesion.  There is extensive periventricular white matter
lucency consistent with chronic small vessel ischemic disease,
unchanged.  There are several tiny old lacunar infarcts in the
basal ganglia.

No acute osseous abnormality.
IMPRESSION: No acute abnormality.  Chronic small vessel ischemic changes in the
periventricular white matter.  Old tiny lacunar infarcts.

## 2013-02-04 ENCOUNTER — Encounter (HOSPITAL_COMMUNITY): Payer: Self-pay | Admitting: Emergency Medicine

## 2013-02-04 ENCOUNTER — Inpatient Hospital Stay (HOSPITAL_COMMUNITY)
Admission: EM | Admit: 2013-02-04 | Discharge: 2013-03-09 | DRG: 871 | Disposition: E | Payer: Medicare Other | Attending: Family Medicine | Admitting: Family Medicine

## 2013-02-04 ENCOUNTER — Emergency Department (HOSPITAL_COMMUNITY): Payer: Medicare Other

## 2013-02-04 DIAGNOSIS — K922 Gastrointestinal hemorrhage, unspecified: Secondary | ICD-10-CM

## 2013-02-04 DIAGNOSIS — I5032 Chronic diastolic (congestive) heart failure: Secondary | ICD-10-CM | POA: Diagnosis present

## 2013-02-04 DIAGNOSIS — K921 Melena: Secondary | ICD-10-CM | POA: Diagnosis not present

## 2013-02-04 DIAGNOSIS — N390 Urinary tract infection, site not specified: Secondary | ICD-10-CM | POA: Diagnosis present

## 2013-02-04 DIAGNOSIS — I509 Heart failure, unspecified: Secondary | ICD-10-CM

## 2013-02-04 DIAGNOSIS — I5033 Acute on chronic diastolic (congestive) heart failure: Secondary | ICD-10-CM

## 2013-02-04 DIAGNOSIS — E039 Hypothyroidism, unspecified: Secondary | ICD-10-CM | POA: Diagnosis present

## 2013-02-04 DIAGNOSIS — R791 Abnormal coagulation profile: Secondary | ICD-10-CM | POA: Diagnosis not present

## 2013-02-04 DIAGNOSIS — R6520 Severe sepsis without septic shock: Secondary | ICD-10-CM

## 2013-02-04 DIAGNOSIS — E876 Hypokalemia: Secondary | ICD-10-CM | POA: Diagnosis not present

## 2013-02-04 DIAGNOSIS — Z515 Encounter for palliative care: Secondary | ICD-10-CM

## 2013-02-04 DIAGNOSIS — I4891 Unspecified atrial fibrillation: Secondary | ICD-10-CM | POA: Diagnosis present

## 2013-02-04 DIAGNOSIS — J9600 Acute respiratory failure, unspecified whether with hypoxia or hypercapnia: Secondary | ICD-10-CM | POA: Diagnosis present

## 2013-02-04 DIAGNOSIS — J449 Chronic obstructive pulmonary disease, unspecified: Secondary | ICD-10-CM | POA: Diagnosis present

## 2013-02-04 DIAGNOSIS — J690 Pneumonitis due to inhalation of food and vomit: Secondary | ICD-10-CM | POA: Diagnosis present

## 2013-02-04 DIAGNOSIS — J811 Chronic pulmonary edema: Secondary | ICD-10-CM

## 2013-02-04 DIAGNOSIS — A419 Sepsis, unspecified organism: Principal | ICD-10-CM | POA: Diagnosis present

## 2013-02-04 DIAGNOSIS — J4489 Other specified chronic obstructive pulmonary disease: Secondary | ICD-10-CM | POA: Diagnosis present

## 2013-02-04 DIAGNOSIS — E860 Dehydration: Secondary | ICD-10-CM | POA: Diagnosis present

## 2013-02-04 DIAGNOSIS — G589 Mononeuropathy, unspecified: Secondary | ICD-10-CM | POA: Diagnosis present

## 2013-02-04 DIAGNOSIS — Z79899 Other long term (current) drug therapy: Secondary | ICD-10-CM

## 2013-02-04 DIAGNOSIS — Z22322 Carrier or suspected carrier of Methicillin resistant Staphylococcus aureus: Secondary | ICD-10-CM

## 2013-02-04 DIAGNOSIS — F411 Generalized anxiety disorder: Secondary | ICD-10-CM | POA: Diagnosis present

## 2013-02-04 DIAGNOSIS — R6510 Systemic inflammatory response syndrome (SIRS) of non-infectious origin without acute organ dysfunction: Secondary | ICD-10-CM | POA: Diagnosis present

## 2013-02-04 DIAGNOSIS — N12 Tubulo-interstitial nephritis, not specified as acute or chronic: Secondary | ICD-10-CM | POA: Diagnosis present

## 2013-02-04 DIAGNOSIS — Z66 Do not resuscitate: Secondary | ICD-10-CM | POA: Diagnosis not present

## 2013-02-04 DIAGNOSIS — M1990 Unspecified osteoarthritis, unspecified site: Secondary | ICD-10-CM | POA: Diagnosis present

## 2013-02-04 DIAGNOSIS — Z7901 Long term (current) use of anticoagulants: Secondary | ICD-10-CM

## 2013-02-04 DIAGNOSIS — D649 Anemia, unspecified: Secondary | ICD-10-CM

## 2013-02-04 LAB — POCT I-STAT 3, ART BLOOD GAS (G3+)
ACID-BASE EXCESS: 1 mmol/L (ref 0.0–2.0)
Bicarbonate: 26.3 mEq/L — ABNORMAL HIGH (ref 20.0–24.0)
O2 Saturation: 98 %
PCO2 ART: 47.5 mmHg — AB (ref 35.0–45.0)
PH ART: 7.358 (ref 7.350–7.450)
PO2 ART: 110 mmHg — AB (ref 80.0–100.0)
Patient temperature: 101.1
TCO2: 28 mmol/L (ref 0–100)

## 2013-02-04 LAB — URINALYSIS, ROUTINE W REFLEX MICROSCOPIC
BILIRUBIN URINE: NEGATIVE
Glucose, UA: NEGATIVE mg/dL
Ketones, ur: NEGATIVE mg/dL
Nitrite: NEGATIVE
PROTEIN: 30 mg/dL — AB
Specific Gravity, Urine: 1.024 (ref 1.005–1.030)
UROBILINOGEN UA: 0.2 mg/dL (ref 0.0–1.0)
pH: 6.5 (ref 5.0–8.0)

## 2013-02-04 LAB — URINE MICROSCOPIC-ADD ON

## 2013-02-04 MED ORDER — DILTIAZEM HCL 100 MG IV SOLR
5.0000 mg/h | INTRAVENOUS | Status: DC
  Administered 2013-02-05: 5 mg/h via INTRAVENOUS
  Filled 2013-02-04: qty 100

## 2013-02-04 MED ORDER — VANCOMYCIN HCL IN DEXTROSE 1-5 GM/200ML-% IV SOLN
1000.0000 mg | Freq: Once | INTRAVENOUS | Status: AC
  Administered 2013-02-04: 1000 mg via INTRAVENOUS
  Filled 2013-02-04: qty 200

## 2013-02-04 MED ORDER — ACETAMINOPHEN 325 MG PO TABS
650.0000 mg | ORAL_TABLET | Freq: Once | ORAL | Status: AC
  Administered 2013-02-04: 650 mg via ORAL
  Filled 2013-02-04: qty 2

## 2013-02-04 MED ORDER — DILTIAZEM LOAD VIA INFUSION
10.0000 mg | Freq: Once | INTRAVENOUS | Status: AC
  Administered 2013-02-05: 10 mg via INTRAVENOUS
  Filled 2013-02-04: qty 10

## 2013-02-04 MED ORDER — ONDANSETRON HCL 4 MG/2ML IJ SOLN
4.0000 mg | Freq: Once | INTRAMUSCULAR | Status: AC
  Administered 2013-02-04: 4 mg via INTRAVENOUS
  Filled 2013-02-04: qty 2

## 2013-02-04 MED ORDER — SODIUM CHLORIDE 0.9 % IV BOLUS (SEPSIS)
500.0000 mL | Freq: Once | INTRAVENOUS | Status: AC
  Administered 2013-02-04: 500 mL via INTRAVENOUS

## 2013-02-04 MED ORDER — PIPERACILLIN-TAZOBACTAM 3.375 G IVPB 30 MIN
3.3750 g | Freq: Once | INTRAVENOUS | Status: AC
  Administered 2013-02-04: 3.375 g via INTRAVENOUS
  Filled 2013-02-04: qty 50

## 2013-02-04 NOTE — ED Notes (Signed)
EMS called to [facility] for SOB.  Pt give neb treatment by staff, and found to be satting 80% on nrb.  Pt started on CPAP and given 10mg  cardiazem and 4mg  zofran.

## 2013-02-04 NOTE — ED Provider Notes (Signed)
CSN: [id number]     Arrival date & time 01/10/2013  2223 History   First MD Initiated Contact with Patient 02/03/2013 2223     Chief Complaint  Patient presents with  . Shortness of Breath   (Consider location/radiation/quality/duration/timing/severity/associated sxs/prior Treatment) Patient is a 80 y.o. female presenting with shortness of breath. The history is provided by the patient and the EMS personnel.  Shortness of Breath Severity:  Moderate Onset quality:  Sudden Duration:  2 hours Timing:  Constant Progression:  Unchanged Chronicity:  New Context: not activity   Relieved by:  Nothing Worsened by:  Nothing tried Ineffective treatments:  None tried Associated symptoms: chest pain, fever and vomiting   Associated symptoms: no abdominal pain, no cough, no headaches, no hemoptysis, no neck pain, no syncope and no wheezing   Risk factors: no hx of PE/DVT       Past Medical History  Diagnosis Date  . Chronic atrial fibrillation   . History of GI bleed   . Neuropathy   . Fatigue   . Palpitations   . SOB (shortness of breath)   . OA (osteoarthritis)   . Weakness   . Forgetfulness   . Anxiety   . Depression    Past Surgical History  Procedure Laterality Date  . Umbilical hernia repair    . Appendectomy    . Knee surgery    . Tonsillectomy    . Vaginal hysterectomy    . US echocardiography  08/07/2002    EF 60-65%   Family History  Problem Relation Age of Onset  . Gallbladder disease Mother   . Pancreatic cancer Father    History  Substance Use Topics  . Smoking status: Never Smoker   . Smokeless tobacco: Not on file  . Alcohol Use: No   OB History   Grav Para Term Preterm Abortions TAB SAB Ect Mult Living                 Review of Systems  Constitutional: Positive for fever. Negative for chills.  HENT: Negative for congestion and rhinorrhea.   Eyes: Negative for redness and visual disturbance.  Respiratory: Positive for shortness of breath. Negative  for cough, hemoptysis and wheezing.   Cardiovascular: Positive for chest pain. Negative for palpitations and syncope.  Gastrointestinal: Positive for vomiting. Negative for nausea and abdominal pain.  Genitourinary: Negative for dysuria and urgency.  Musculoskeletal: Negative for arthralgias, myalgias and neck pain.  Skin: Negative for pallor and wound.  Neurological: Negative for dizziness and headaches.    Allergies  Boniva; Cymbalta; Fentanyl; and Lyrica  Home Medications   Current Outpatient Rx  Name  Route  Sig  Dispense  Refill  . acetaminophen (TYLENOL) 325 MG tablet   Oral   Take 650 mg by mouth every 4 (four) hours as needed for mild pain or fever.         . ALPRAZolam (XANAX) 0.25 MG tablet   Oral   Take 0.25 mg by mouth every 6 (six) hours as needed for anxiety.          Marland Kitchen antiseptic oral rinse (BIOTENE) LIQD   Mouth Rinse   15 mLs by Mouth Rinse route as needed for dry mouth.         . calcium-vitamin D (OSCAL WITH D) 500-200 MG-UNIT per tablet   Oral   Take 1 tablet by mouth 2 (two) times daily.           . Coenzyme Q10 200  MG capsule   Oral   Take 200 mg by mouth 2 (two) times daily.         Marland Kitchen gabapentin (NEURONTIN) 300 MG capsule   Oral   Take 300 mg by mouth 2 (two) times daily.         Marland Kitchen HYDROcodone-acetaminophen (NORCO) 5-325 MG per tablet   Oral   Take 1 tablet by mouth every 4 (four) hours as needed for moderate pain.          Marland Kitchen levothyroxine (SYNTHROID, LEVOTHROID) 75 MCG tablet   Oral   Take 75 mcg by mouth daily.           Marland Kitchen omega-3 acid ethyl esters (LOVAZA) 1 G capsule   Oral   Take 1 g by mouth every evening.         Bertram Gala Glycol-Propyl Glycol (SYSTANE) 0.4-0.3 % SOLN   Both Eyes   Place 2 drops into both eyes 3 (three) times daily.         . polyethylene glycol (MIRALAX / GLYCOLAX) packet   Oral   Take 17 g by mouth 2 (two) times daily. For constipation         . polysaccharide iron (NIFEREX) 150 MG  CAPS capsule   Oral   Take 150 mg by mouth every other day.          . potassium chloride (KLOR-CON) 8 MEQ tablet   Oral   Take 8 mEq by mouth 2 (two) times daily.         Marland Kitchen senna-docusate (SENOKOT-S) 8.6-50 MG per tablet   Oral   Take 1 tablet by mouth daily as needed for mild constipation.         Marland Kitchen tiotropium (SPIRIVA) 18 MCG inhalation capsule   Inhalation   Place 18 mcg into inhaler and inhale daily.          . Vitamin D, Cholecalciferol, 400 UNITS CHEW   Oral   Chew 1 tablet (400 Units total) by mouth 2 (two) times daily.   60 tablet   3   . warfarin (COUMADIN) 2 MG tablet   Oral   Take 2 mg by mouth daily.          BP 134/56  Pulse 97  Temp(Src) 101.8 F (38.8 C) (Rectal)  Resp 25  SpO2 95% Physical Exam  Constitutional: She is oriented to person, place, and time. She appears distressed.  HENT:  Head: Normocephalic and atraumatic.  Eyes: EOM are normal. Pupils are equal, round, and reactive to light.  Neck: Normal range of motion. Neck supple.  Cardiovascular: Normal rate and regular rhythm.  Exam reveals no gallop and no friction rub.   No murmur heard. Pulmonary/Chest: Effort normal. She has no wheezes. She has no rales.  Course breath sounds bilaterally tachypnea  Abdominal: Soft. She exhibits no distension. There is no tenderness. There is no rebound and no guarding.  Musculoskeletal: She exhibits no edema and no tenderness.  Neurological: She is alert and oriented to person, place, and time.  Skin: Skin is warm and dry. She is not diaphoretic.  Psychiatric: She has a normal mood and affect. Her behavior is normal.    ED Course  Procedures (including critical care time) Labs Review Labs Reviewed  CBC WITH DIFFERENTIAL - Abnormal; Notable for the following:    RBC 3.28 (*)    Hemoglobin 10.1 (*)    HCT 30.5 (*)    RDW 16.8 (*)  Neutrophils Relative % 94 (*)    Neutro Abs 8.7 (*)    Lymphocytes Relative 3 (*)    Lymphs Abs 0.3 (*)     All other components within normal limits  COMPREHENSIVE METABOLIC PANEL - Abnormal; Notable for the following:    Sodium 136 (*)    Glucose, Bld 214 (*)    BUN 24 (*)    Albumin 2.6 (*)    GFR calc non Af Amer 67 (*)    GFR calc Af Amer 78 (*)    All other components within normal limits  URINALYSIS, ROUTINE W REFLEX MICROSCOPIC - Abnormal; Notable for the following:    APPearance CLOUDY (*)    Hgb urine dipstick SMALL (*)    Protein, ur 30 (*)    Leukocytes, UA MODERATE (*)    All other components within normal limits  URINE MICROSCOPIC-ADD ON - Abnormal; Notable for the following:    Bacteria, UA MANY (*)    Casts HYALINE CASTS (*)    All other components within normal limits  POCT I-STAT 3, BLOOD GAS (G3+) - Abnormal; Notable for the following:    pCO2 arterial 47.5 (*)    pO2, Arterial 110.0 (*)    Bicarbonate 26.3 (*)    All other components within normal limits  POCT I-STAT, CHEM 8 - Abnormal; Notable for the following:    BUN 24 (*)    Glucose, Bld 218 (*)    Hemoglobin 11.6 (*)    HCT 34.0 (*)    All other components within normal limits  CULTURE, BLOOD (ROUTINE X 2)  CULTURE, BLOOD (ROUTINE X 2)  URINE CULTURE  LIPASE, BLOOD  PRO B NATRIURETIC PEPTIDE   Imaging Review Dg Chest Portable 1 View  02/03/2013   CLINICAL DATA:  Shortness of breath, chest pain, respiratory distress  EXAM: PORTABLE CHEST - 1 VIEW  COMPARISON:  11/19/2012  FINDINGS: Stable cardiomegaly with vascular congestion and chronic interstitial prominence. No definite CHF or focal pneumonia. No effusion pneumothorax. Atherosclerosis of the aorta. Overlying costal cartilage calcifications noted. Degenerative changes of the spine.  IMPRESSION: Cardiomegaly vascular congestion.  No superimposed acute process   Electronically Signed   By: Ruel Favors M.D.   On: 01/09/2013 22:47    EKG Interpretation    Date/Time:  Tuesday February 04 2013 22:26:58 EST Ventricular Rate:  160 PR Interval:  52 QRS  Duration: 102 QT Interval:  293 QTC Calculation: 478 R Axis:   -88 Text Interpretation:  A fib RVR Multiple ventricular premature complexes Left axis deviation Poor baseline Confirmed by ZAVITZ  MD, JOSHUA ([id number]) on 01/17/2013 11:17:19 PM            MDM   1. Urinary tract infection   2. Atrial fibrillation   3. Pulmonary edema   4. SIRS (systemic inflammatory response syndrome)      80 year old female comes in with a chief complaint of shortness of breath and chest pain. Patient was picked up by EMS the story told by the nursing home staff was that she had some nausea vomiting and diarrhea and it aspirated. Patient denies that story states that she has substernal chest pain shortness of breath with it. Patient found to be in A. fib with RVR on picked up by EMS given 10 mg Cardizem without resolution.  Heart rate in the 160s on arrival, appears to be A. fib with RVR.  Patient febrile to 101 here we'll treat is good sepsis blood cultures drawn chest x-ray  does not show focal pneumonia we'll treat with vancomycin and Zosyn  Patient with underlying COPD found to have unremarkable ABG taken off CPAP agent with O2 sats in the 91-93 range while on 6 L of oxygen. Patient with slight improvement in heart rate off CPAP, now denying chest pain.  Patient with no white count and no pneumonia on chest x-ray, patient with worsening respiratory status tiring in the room O2 sat and he placed back on CPAP with possible improvement. Urinary tract infection and noted on UA  Melene Planan Teya Otterson, MD 02/05/13 [phone]

## 2013-02-05 ENCOUNTER — Inpatient Hospital Stay (HOSPITAL_COMMUNITY): Payer: Medicare Other

## 2013-02-05 DIAGNOSIS — J811 Chronic pulmonary edema: Secondary | ICD-10-CM

## 2013-02-05 DIAGNOSIS — N390 Urinary tract infection, site not specified: Secondary | ICD-10-CM

## 2013-02-05 DIAGNOSIS — I4891 Unspecified atrial fibrillation: Secondary | ICD-10-CM

## 2013-02-05 DIAGNOSIS — R6510 Systemic inflammatory response syndrome (SIRS) of non-infectious origin without acute organ dysfunction: Secondary | ICD-10-CM

## 2013-02-05 DIAGNOSIS — I369 Nonrheumatic tricuspid valve disorder, unspecified: Secondary | ICD-10-CM

## 2013-02-05 LAB — CBC
HCT: 30.8 % — ABNORMAL LOW (ref 36.0–46.0)
Hemoglobin: 10.1 g/dL — ABNORMAL LOW (ref 12.0–15.0)
MCH: 30.5 pg (ref 26.0–34.0)
MCHC: 32.8 g/dL (ref 30.0–36.0)
MCV: 93.1 fL (ref 78.0–100.0)
PLATELETS: 272 10*3/uL (ref 150–400)
RBC: 3.31 MIL/uL — ABNORMAL LOW (ref 3.87–5.11)
RDW: 16.9 % — AB (ref 11.5–15.5)
WBC: 9.6 10*3/uL (ref 4.0–10.5)

## 2013-02-05 LAB — COMPREHENSIVE METABOLIC PANEL
ALT: 10 U/L (ref 0–35)
AST: 20 U/L (ref 0–37)
Albumin: 2.6 g/dL — ABNORMAL LOW (ref 3.5–5.2)
Alkaline Phosphatase: 70 U/L (ref 39–117)
BUN: 24 mg/dL — ABNORMAL HIGH (ref 6–23)
CHLORIDE: 97 meq/L (ref 96–112)
CO2: 21 mEq/L (ref 19–32)
CREATININE: 0.77 mg/dL (ref 0.50–1.10)
Calcium: 8.8 mg/dL (ref 8.4–10.5)
GFR, EST AFRICAN AMERICAN: 78 mL/min — AB (ref >90)
GFR, EST NON AFRICAN AMERICAN: 67 mL/min — AB (ref >90)
Glucose, Bld: 214 mg/dL — ABNORMAL HIGH (ref 70–99)
Potassium: 4.8 mEq/L (ref 3.7–5.3)
SODIUM: 136 meq/L — AB (ref 137–147)
Total Bilirubin: 0.4 mg/dL (ref 0.3–1.2)
Total Protein: 7.2 g/dL (ref 6.0–8.3)

## 2013-02-05 LAB — CBC WITH DIFFERENTIAL/PLATELET
BASOS ABS: 0 10*3/uL (ref 0.0–0.1)
Basophils Relative: 0 % (ref 0–1)
EOS PCT: 0 % (ref 0–5)
Eosinophils Absolute: 0 10*3/uL (ref 0.0–0.7)
HEMATOCRIT: 30.5 % — AB (ref 36.0–46.0)
Hemoglobin: 10.1 g/dL — ABNORMAL LOW (ref 12.0–15.0)
LYMPHS PCT: 3 % — AB (ref 12–46)
Lymphs Abs: 0.3 10*3/uL — ABNORMAL LOW (ref 0.7–4.0)
MCH: 30.8 pg (ref 26.0–34.0)
MCHC: 33.1 g/dL (ref 30.0–36.0)
MCV: 93 fL (ref 78.0–100.0)
MONO ABS: 0.3 10*3/uL (ref 0.1–1.0)
Monocytes Relative: 3 % (ref 3–12)
Neutro Abs: 8.7 10*3/uL — ABNORMAL HIGH (ref 1.7–7.7)
Neutrophils Relative %: 94 % — ABNORMAL HIGH (ref 43–77)
Platelets: 283 10*3/uL (ref 150–400)
RBC: 3.28 MIL/uL — AB (ref 3.87–5.11)
RDW: 16.8 % — AB (ref 11.5–15.5)
WBC: 9.3 10*3/uL (ref 4.0–10.5)

## 2013-02-05 LAB — BASIC METABOLIC PANEL
BUN: 28 mg/dL — AB (ref 6–23)
CALCIUM: 9 mg/dL (ref 8.4–10.5)
CHLORIDE: 100 meq/L (ref 96–112)
CO2: 21 mEq/L (ref 19–32)
CREATININE: 1.04 mg/dL (ref 0.50–1.10)
GFR, EST AFRICAN AMERICAN: 50 mL/min — AB (ref >90)
GFR, EST NON AFRICAN AMERICAN: 43 mL/min — AB (ref >90)
Glucose, Bld: 167 mg/dL — ABNORMAL HIGH (ref 70–99)
Potassium: 4.9 mEq/L (ref 3.7–5.3)
Sodium: 139 mEq/L (ref 137–147)

## 2013-02-05 LAB — TROPONIN I
Troponin I: <0.3 ng/mL (ref <0.30)
Troponin I: <0.3 ng/mL (ref <0.30)

## 2013-02-05 LAB — POCT I-STAT, CHEM 8
BUN: 24 mg/dL — AB (ref 6–23)
CREATININE: 0.8 mg/dL (ref 0.50–1.10)
Calcium, Ion: 1.16 mmol/L (ref 1.13–1.30)
Chloride: 101 mEq/L (ref 96–112)
Glucose, Bld: 218 mg/dL — ABNORMAL HIGH (ref 70–99)
HCT: 34 % — ABNORMAL LOW (ref 36.0–46.0)
HEMOGLOBIN: 11.6 g/dL — AB (ref 12.0–15.0)
POTASSIUM: 4.4 meq/L (ref 3.7–5.3)
SODIUM: 138 meq/L (ref 137–147)
TCO2: 23 mmol/L (ref 0–100)

## 2013-02-05 LAB — LIPASE, BLOOD: Lipase: 26 U/L (ref 11–59)

## 2013-02-05 LAB — INFLUENZA PANEL BY PCR (TYPE A & B)
H1N1 flu by pcr: NOT DETECTED
INFLAPCR: NEGATIVE
Influenza B By PCR: NEGATIVE

## 2013-02-05 LAB — MRSA PCR SCREENING: MRSA by PCR: POSITIVE — AB

## 2013-02-05 LAB — TSH: TSH: 0.708 u[IU]/mL (ref 0.350–4.500)

## 2013-02-05 LAB — PROTIME-INR
INR: 2.82 — AB (ref 0.00–1.49)
Prothrombin Time: 28.7 seconds — ABNORMAL HIGH (ref 11.6–15.2)

## 2013-02-05 LAB — CLOSTRIDIUM DIFFICILE BY PCR: Toxigenic C. Difficile by PCR: NEGATIVE

## 2013-02-05 MED ORDER — CHLORHEXIDINE GLUCONATE CLOTH 2 % EX PADS
6.0000 | MEDICATED_PAD | Freq: Every day | CUTANEOUS | Status: DC
  Administered 2013-02-05 – 2013-02-07 (×3): 6 via TOPICAL

## 2013-02-05 MED ORDER — DEXTROSE 5 % IV SOLN: 5.0000 mg/h | INTRAVENOUS | Status: DC

## 2013-02-05 MED ORDER — LEVALBUTEROL HCL 0.63 MG/3ML IN NEBU: 0.6300 mg | INHALATION_SOLUTION | RESPIRATORY_TRACT | Status: DC | PRN

## 2013-02-05 MED ORDER — METHYLPREDNISOLONE SODIUM SUCC 125 MG IJ SOLR
80.0000 mg | Freq: Once | INTRAMUSCULAR | Status: AC
  Administered 2013-02-05: 80 mg via INTRAVENOUS
  Filled 2013-02-05: qty 2

## 2013-02-05 MED ORDER — STARCH (THICKENING) PO POWD
ORAL | Status: DC | PRN
  Filled 2013-02-05: qty 227

## 2013-02-05 MED ORDER — LEVOFLOXACIN IN D5W 750 MG/150ML IV SOLN
750.0000 mg | INTRAVENOUS | Status: DC
  Administered 2013-02-05 – 2013-02-07 (×2): 750 mg via INTRAVENOUS
  Filled 2013-02-05 (×2): qty 150

## 2013-02-05 MED ORDER — MORPHINE SULFATE 2 MG/ML IJ SOLN
INTRAMUSCULAR | Status: AC
  Filled 2013-02-05: qty 1

## 2013-02-05 MED ORDER — ALBUTEROL SULFATE (2.5 MG/3ML) 0.083% IN NEBU
2.5000 mg | INHALATION_SOLUTION | RESPIRATORY_TRACT | Status: DC | PRN
  Filled 2013-02-05: qty 3

## 2013-02-05 MED ORDER — MORPHINE SULFATE 2 MG/ML IJ SOLN
1.0000 mg | INTRAMUSCULAR | Status: DC | PRN
  Administered 2013-02-05: 1 mg via INTRAVENOUS
  Filled 2013-02-05: qty 1

## 2013-02-05 MED ORDER — CHLORHEXIDINE GLUCONATE 0.12 % MT SOLN
15.0000 mL | Freq: Two times a day (BID) | OROMUCOSAL | Status: DC
  Administered 2013-02-05 – 2013-02-08 (×6): 15 mL via OROMUCOSAL
  Filled 2013-02-05 (×6): qty 15

## 2013-02-05 MED ORDER — HALOPERIDOL LACTATE 5 MG/ML IJ SOLN
1.0000 mg | Freq: Four times a day (QID) | INTRAMUSCULAR | Status: DC | PRN
  Administered 2013-02-05 – 2013-02-07 (×3): 2 mg via INTRAVENOUS
  Filled 2013-02-05 (×3): qty 1

## 2013-02-05 MED ORDER — POLYETHYL GLYCOL-PROPYL GLYCOL 0.4-0.3 % OP SOLN: 2.0000 [drp] | Freq: Three times a day (TID) | OPHTHALMIC | Status: DC

## 2013-02-05 MED ORDER — PIPERACILLIN-TAZOBACTAM 3.375 G IVPB
3.3750 g | Freq: Three times a day (TID) | INTRAVENOUS | Status: DC
  Administered 2013-02-05 (×2): 3.375 g via INTRAVENOUS
  Filled 2013-02-05 (×4): qty 50

## 2013-02-05 MED ORDER — MUPIROCIN 2 % EX OINT
1.0000 "application " | TOPICAL_OINTMENT | Freq: Two times a day (BID) | CUTANEOUS | Status: DC
  Administered 2013-02-05 – 2013-02-07 (×6): 1 via NASAL
  Filled 2013-02-05: qty 22

## 2013-02-05 MED ORDER — FUROSEMIDE 10 MG/ML IJ SOLN
40.0000 mg | Freq: Once | INTRAMUSCULAR | Status: AC
  Administered 2013-02-05: 40 mg via INTRAVENOUS
  Filled 2013-02-05: qty 4

## 2013-02-05 MED ORDER — IPRATROPIUM-ALBUTEROL 0.5-2.5 (3) MG/3ML IN SOLN: 6.0000 mL | Freq: Once | RESPIRATORY_TRACT | Status: DC

## 2013-02-05 MED ORDER — DEXTROSE-NACL 5-0.45 % IV SOLN
INTRAVENOUS | Status: DC
  Administered 2013-02-05 (×2): via INTRAVENOUS
  Administered 2013-02-06: 75 mL/h via INTRAVENOUS

## 2013-02-05 MED ORDER — SODIUM CHLORIDE 0.9 % IJ SOLN
3.0000 mL | Freq: Two times a day (BID) | INTRAMUSCULAR | Status: DC
  Administered 2013-02-05 – 2013-02-06 (×4): 3 mL via INTRAVENOUS
  Administered 2013-02-06: 14:00:00 via INTRAVENOUS

## 2013-02-05 MED ORDER — BIOTENE DRY MOUTH MT LIQD
15.0000 mL | Freq: Two times a day (BID) | OROMUCOSAL | Status: DC
  Administered 2013-02-05 – 2013-02-09 (×10): 15 mL via OROMUCOSAL

## 2013-02-05 MED ORDER — ONDANSETRON HCL 4 MG/2ML IJ SOLN
4.0000 mg | Freq: Four times a day (QID) | INTRAMUSCULAR | Status: DC | PRN
Start: 2013-02-05 — End: 2013-02-10

## 2013-02-05 MED ORDER — IPRATROPIUM-ALBUTEROL 0.5-2.5 (3) MG/3ML IN SOLN
3.0000 mL | Freq: Once | RESPIRATORY_TRACT | Status: AC
  Administered 2013-02-05: 3 mL via RESPIRATORY_TRACT
  Filled 2013-02-05: qty 3

## 2013-02-05 MED ORDER — HALOPERIDOL LACTATE 5 MG/ML IJ SOLN: 1.0000 mg | Freq: Four times a day (QID) | INTRAMUSCULAR | Status: DC | PRN

## 2013-02-05 MED ORDER — LEVOTHYROXINE SODIUM 50 MCG PO TABS
50.0000 ug | ORAL_TABLET | Freq: Every day | ORAL | Status: DC
  Administered 2013-02-06 – 2013-02-08 (×3): 50 ug via ORAL
  Filled 2013-02-05 (×4): qty 1

## 2013-02-05 MED ORDER — VANCOMYCIN HCL 500 MG IV SOLR
500.0000 mg | INTRAVENOUS | Status: DC
  Administered 2013-02-06 – 2013-02-08 (×3): 500 mg via INTRAVENOUS
  Filled 2013-02-05 (×3): qty 500

## 2013-02-05 MED ORDER — LEVOTHYROXINE SODIUM 100 MCG IV SOLR
25.0000 ug | Freq: Every day | INTRAVENOUS | Status: DC
  Administered 2013-02-05: 25 ug via INTRAVENOUS
  Filled 2013-02-05: qty 5

## 2013-02-05 MED ORDER — WARFARIN - PHARMACIST DOSING INPATIENT
Freq: Every day | Status: DC
  Administered 2013-02-05: 18:00:00

## 2013-02-05 MED ORDER — WARFARIN SODIUM 2 MG PO TABS
2.0000 mg | ORAL_TABLET | Freq: Every day | ORAL | Status: DC
  Administered 2013-02-05: 2 mg via ORAL
  Filled 2013-02-05 (×2): qty 1

## 2013-02-05 MED ORDER — POLYVINYL ALCOHOL 1.4 % OP SOLN
2.0000 [drp] | Freq: Three times a day (TID) | OPHTHALMIC | Status: DC
  Administered 2013-02-05 – 2013-02-09 (×13): 2 [drp] via OPHTHALMIC
  Filled 2013-02-05 (×2): qty 15

## 2013-02-05 MED ORDER — MORPHINE SULFATE 2 MG/ML IJ SOLN
1.0000 mg | INTRAMUSCULAR | Status: DC | PRN
  Administered 2013-02-05: 4 mg via INTRAVENOUS
  Administered 2013-02-05: 2 mg via INTRAVENOUS
  Administered 2013-02-05: 1 mg via INTRAVENOUS
  Administered 2013-02-05: 2 mg via INTRAVENOUS
  Administered 2013-02-06: 4 mg via INTRAVENOUS
  Administered 2013-02-06 (×2): 2 mg via INTRAVENOUS
  Administered 2013-02-06 – 2013-02-07 (×2): 4 mg via INTRAVENOUS
  Administered 2013-02-07 (×3): 2 mg via INTRAVENOUS
  Administered 2013-02-07: 4 mg via INTRAVENOUS
  Administered 2013-02-08 (×3): 2 mg via INTRAVENOUS
  Filled 2013-02-05: qty 2
  Filled 2013-02-05 (×3): qty 1
  Filled 2013-02-05: qty 2
  Filled 2013-02-05 (×2): qty 1
  Filled 2013-02-05 (×2): qty 2
  Filled 2013-02-05: qty 1
  Filled 2013-02-05: qty 2
  Filled 2013-02-05 (×4): qty 1
  Filled 2013-02-05: qty 2

## 2013-02-05 MED ORDER — ONDANSETRON HCL 4 MG PO TABS: 4.0000 mg | ORAL_TABLET | Freq: Four times a day (QID) | ORAL | Status: DC | PRN

## 2013-02-05 MED ORDER — HEPARIN SODIUM (PORCINE) 5000 UNIT/ML IJ SOLN
5000.0000 [IU] | Freq: Three times a day (TID) | INTRAMUSCULAR | Status: DC
  Administered 2013-02-05: 5000 [IU] via SUBCUTANEOUS
  Filled 2013-02-05 (×4): qty 1

## 2013-02-05 MED ORDER — ALBUTEROL SULFATE (2.5 MG/3ML) 0.083% IN NEBU
2.5000 mg | INHALATION_SOLUTION | Freq: Four times a day (QID) | RESPIRATORY_TRACT | Status: DC
  Administered 2013-02-05 (×2): 2.5 mg via RESPIRATORY_TRACT
  Filled 2013-02-05: qty 3

## 2013-02-05 MED ORDER — METOPROLOL TARTRATE 1 MG/ML IV SOLN
2.5000 mg | Freq: Four times a day (QID) | INTRAVENOUS | Status: DC
  Administered 2013-02-05 – 2013-02-08 (×12): 2.5 mg via INTRAVENOUS
  Filled 2013-02-05 (×19): qty 5

## 2013-02-05 NOTE — ED Provider Notes (Signed)
Medical screening examination/treatment/procedure(s) were conducted as a shared visit with non-physician practitioner(s) or resident and myself. I personally evaluated the patient during the encounter and agree with the findings and plan unless otherwise indicated.  I have personally reviewed any xrays and/ or EKG's with the provider and I agree with interpretation.  Worsening sob since 2 hrs PTA, mild GI sxs earlier today, possible aspiration event. Exam exp wheeze/ rales, tachypnea, retractions, abd soft/ NT, mild dry mm, a fib. EKG reviewed, a fib rvr. Cardizem drip titrated, pt improved on recheck however later on pt high 80's on 6 L [place]. Bipap placed back onto pt, pt improved. CXR congestion noted. HCAP sepsis treatment given with NH exposure and fever/ tachycardia. Step down admission placed. Duoneb and steroids ordered. Lasix per admitting team.  CRITICAL CARE Performed by: Enid SkeensZAVITZ, Ellinor Test M  Total critical care time: 35 min  Critical care time was exclusive of separately billable procedures and treating other patients.  Critical care was necessary to treat or prevent imminent or life-threatening deterioration.  Critical care was time spent personally by me on the following activities: development of treatment plan with patient and/or surrogate as well as nursing, discussions with consultants, evaluation of patient's response to treatment, examination of patient, obtaining history from patient or surrogate, ordering and performing treatments and interventions, ordering and review of laboratory studies, ordering and review of radiographic studies, pulse oximetry and re-evaluation of patient's condition.  Acute respiratory distress, Pulmonary Edema, Sepsis, A fib rvr  Enid SkeensJoshua M Aundrea Higginbotham, MD 02/05/13 [phone]

## 2013-02-05 NOTE — Progress Notes (Signed)
CSW called contact number listed for pt's daughter [name], explaining chart states pt is from a facility but we do not have the name of the facility. CSW requested daughter call back at her earliest convenience to tell CSW if pt is from a SNF or an ALF, and to provide the name of the facility. CSW will complete full assessment when daughter calls.   Maryclare LabradorJulie Aero Drummonds, MSW, [facility] Clinical Social Worker [phone]

## 2013-02-05 NOTE — Progress Notes (Signed)
  Echocardiogram 2D Echocardiogram has been performed.  [name] 02/05/2013, 4:10 PM

## 2013-02-05 NOTE — Progress Notes (Signed)
Dr Sharon SellerMcclung notified of brown, foul smelling sputum. Patient very agitated and anxious. Placed back on Bipap. Daughter at bedside.

## 2013-02-05 NOTE — Care Management Note (Signed)
    Page 1 of 1   02/05/2013     12:09:14 PM   CARE MANAGEMENT NOTE 02/05/2013  Patient:  [name],[name]   Account Number:  [id number]  Date Initiated:  02/05/2013  Documentation initiated by:  Junius CreamerWELL,DEBBIE  Subjective/Objective Assessment:   adm [name] sirs     Action/Plan:   from nsg facility   Anticipated DC Date:     Anticipated DC Plan:  SKILLED NURSING FACILITY  In-house referral  Clinical Social Worker      DC Planning Services  CM consult      Choice offered to / List presented to:             Status of service:   Medicare Important Message given?   (If response is "NO", the following Medicare IM given date fields will be blank) Date Medicare IM given:   Date Additional Medicare IM given:    Discharge Disposition:    Per UR Regulation:  Reviewed for med. necessity/level of care/duration of stay  If discussed at Long Length of Stay Meetings, dates discussed:    Comments:

## 2013-02-05 NOTE — Progress Notes (Signed)
Respiratory therapy note, changed to bipap 10/5 40% due to work of breathing and moderate distress, continue to monitor.

## 2013-02-05 NOTE — H&P (Signed)
[facility]                                                                                    Patient Demographics  [name], is a 80 y.o. female  CSN: [id number]  MRN: [id number]  DOB - June 24, 1911  Admit Date - 02/08/2013  Outpatient Primary MD for the patient is No PCP Per Patient   With History of -  Past Medical History  Diagnosis Date  . Chronic atrial fibrillation   . History of GI bleed   . Neuropathy   . Fatigue   . Palpitations   . SOB (shortness of breath)   . OA (osteoarthritis)   . Weakness   . Forgetfulness   . Anxiety   . Depression       Past Surgical History  Procedure Laterality Date  . Umbilical hernia repair    . Appendectomy    . Knee surgery    . Tonsillectomy    . Vaginal hysterectomy    . [place] echocardiography  08/07/2002    EF 60-65%    in for   Chief Complaint  Patient presents with  . Shortness of Breath     HPI  [name]  is a 80 y.o. female, nursing home resident with past medical history significant for atrial fib, GI bleed, who was brought from the nursing home after she had witnessed aspiration and hypoxemia also noted in the ER. Patient was placed on nasal cannula on admission and switched to BiPAP. Patient is DO NOT RESUSCITATE Perlie GoldRussell the patient she was not able to give any history. She was started on Cardizem drip due to   A. fib with rapid ventricular rate.    Review of Systems   Unable to obtain due to patient's condition    Social History History  Substance Use Topics  . Smoking status: Never Smoker   . Smokeless tobacco: Not on file  . Alcohol Use: No     Family History Family History  Problem Relation Age of Onset  . Gallbladder disease Mother   . Pancreatic cancer Father      Prior to Admission medications   Medication Sig Start Date End Date Taking? Authorizing Provider  acetaminophen (TYLENOL) 325 MG tablet Take 650 mg by mouth every 4 (four) hours as needed  for mild pain or fever.   Yes Historical Provider, MD  ALPRAZolam (XANAX) 0.25 MG tablet Take 0.25 mg by mouth every 6 (six) hours as needed for anxiety.    Yes Historical Provider, MD  antiseptic oral rinse (BIOTENE) LIQD 15 mLs by Mouth Rinse route as needed for dry mouth.   Yes Historical Provider, MD  calcium-vitamin D (OSCAL WITH D) 500-200 MG-UNIT per tablet Take 1 tablet by mouth 2 (two) times daily.     Yes Historical Provider, MD  Coenzyme Q10 200 MG capsule Take 200 mg by mouth 2 (two) times daily.   Yes Historical Provider, MD  gabapentin (NEURONTIN) 300 MG capsule Take 300 mg by mouth 2 (two) times daily.   Yes Historical Provider, MD  HYDROcodone-acetaminophen (NORCO) 5-325 MG per tablet Take 1 tablet by mouth every 4 (four)  hours as needed for moderate pain.    Yes Historical Provider, MD  levothyroxine (SYNTHROID, LEVOTHROID) 75 MCG tablet Take 75 mcg by mouth daily.     Yes Historical Provider, MD  omega-3 acid ethyl esters (LOVAZA) 1 G capsule Take 1 g by mouth every evening. 12/30/10  Yes Jarome Matin, MD  Polyethyl Glycol-Propyl Glycol (SYSTANE) 0.4-0.3 % SOLN Place 2 drops into both eyes 3 (three) times daily.   Yes Historical Provider, MD  polyethylene glycol (MIRALAX / GLYCOLAX) packet Take 17 g by mouth 2 (two) times daily. For constipation   Yes Historical Provider, MD  polysaccharide iron (NIFEREX) 150 MG CAPS capsule Take 150 mg by mouth every other day.    Yes Historical Provider, MD  potassium chloride (KLOR-CON) 8 MEQ tablet Take 8 mEq by mouth 2 (two) times daily. 12/30/10  Yes Jarome Matin, MD  senna-docusate (SENOKOT-S) 8.6-50 MG per tablet Take 1 tablet by mouth daily as needed for mild constipation.   Yes Historical Provider, MD  tiotropium (SPIRIVA) 18 MCG inhalation capsule Place 18 mcg into inhaler and inhale daily.    Yes Historical Provider, MD  Vitamin D, Cholecalciferol, 400 UNITS CHEW Chew 1 tablet (400 Units total) by mouth 2 (two) times daily. 12/30/10   Yes Jarome Matin, MD  warfarin (COUMADIN) 2 MG tablet Take 2 mg by mouth daily.   Yes Historical Provider, MD    Allergies  Allergen Reactions  . Boniva [Ibandronic Acid]     Per MAR  . Cymbalta [Duloxetine Hcl]     Per MAR  . Fentanyl     Per MAR  . Lyrica [Pregabalin]     Per MAR    Physical Exam  Vitals  Blood pressure 127/57, pulse 103, temperature 101.8 F (38.8 C), temperature source Rectal, resp. rate 27, SpO2 95.00%.   1. General elderly white female, chronically ill,   2. obtunded  3. No F.N deficits, moving all extremities  4. Ears and Eyes appear Normal, Conjunctivae clear,  Dry Oral Mucosa.  5. Supple Neck, left-sided JVD, No cervical lymphadenopathy appriciated, No Carotid Bruits.  6. chest was tight, scattered rhonchi and wheezing  7. RRR, No Gallops, Rubs or Murmurs, No Parasternal Heave.  8. Positive Bowel Sounds, Abdomen Soft, Non tender, No organomegaly appriciated,No rebound -guarding or rigidity.  9.  No Cyanosis, Normal Skin Turgor, No Skin Rash or Bruise.  10. Good muscle tone,  joints appear normal , no effusions, Normal ROM.  11. No Palpable Lymph Nodes in Neck or Axillae    Data Review  CBC  Recent Labs Lab 03/06/2013 02/05/13 0013  WBC 9.3  --   HGB 10.1* 11.6*  HCT 30.5* 34.0*  PLT 283  --   MCV 93.0  --   MCH 30.8  --   MCHC 33.1  --   RDW 16.8*  --   LYMPHSABS 0.3*  --   MONOABS 0.3  --   EOSABS 0.0  --   BASOSABS 0.0  --    ------------------------------------------------------------------------------------------------------------------  Chemistries   Recent Labs Lab 02/05/13 0013  NA 138  K 4.4  CL 101  GLUCOSE 218*  BUN 24*  CREATININE 0.80   Cardiac Enzymes No results found for this basename: CK, CKMB, TROPONINI, MYOGLOBIN,  in the last 168 hours ------------------------------------------------------------------------------------------------------------------ No components found with this basename:  POCBNP,    ---------------------------------------------------------------------------------------------------------------  Urinalysis    Component Value Date/Time   COLORURINE YELLOW Feb 10, 2013 2312   APPEARANCEUR CLOUDY* 02/20/2013 2312  LABSPEC 1.024 Mar 01, 2013 2312   PHURINE 6.5 2013/03/01 2312   GLUCOSEU NEGATIVE 03/01/13 2312   HGBUR SMALL* 03-01-2013 2312   BILIRUBINUR NEGATIVE 03/01/13 2312   KETONESUR NEGATIVE 03/01/13 2312   PROTEINUR 30* 03/01/13 2312   UROBILINOGEN 0.2 Mar 01, 2013 2312   NITRITE NEGATIVE March 01, 2013 2312   LEUKOCYTESUR MODERATE* 03-01-13 2312    ----------------------------------------------------------------------------------------------------------------  Imaging results:   Dg Chest Portable 1 View  2013/03/01   CLINICAL DATA:  Shortness of breath, chest pain, respiratory distress  EXAM: PORTABLE CHEST - 1 VIEW  COMPARISON:  11/19/2012  FINDINGS: Stable cardiomegaly with vascular congestion and chronic interstitial prominence. No definite CHF or focal pneumonia. No effusion pneumothorax. Atherosclerosis of the aorta. Overlying costal cartilage calcifications noted. Degenerative changes of the spine.  IMPRESSION: Cardiomegaly vascular congestion.  No superimposed acute process   Electronically Signed   By: Ruel Favors M.D.   On: 01-Mar-2013 22:47    My personal review of EKG: Atrial fib with rapid ventricular rate at 160    Assessment & Plan  1. SIRS 2. Aspiration with negative chest x-ray 3. Atrial fib with rapid ventricular rate 4. Urinary tract infection 5. Pulmonary congestion as evidenced by chest x-ray and left jugular vein distention  Plan Admit to step down IV Vanco and Zosyn Lasix IV N.p.o. Keep on BiPAP Nebulizer treatments Check TSH DO NOT RESUSCITATE     DVT Prophylaxis Heparin   AM Labs Ordered, also please review Full Orders    Code Status DO NOT RESUSCITATE  Disposition Plan: Back to nursing home  Time spent  in minutes : 38 min  Condition GUARDED

## 2013-02-05 NOTE — Progress Notes (Addendum)
Clinical Social Work Department CLINICAL SOCIAL WORK PLACEMENT NOTE 02/05/2013  Patient:  [name],[name]  Account Number:  [id number] Admit date:  02/06/2013  Clinical Social Worker:  Maryclare LabradorJULIE Reem Fleury, Theresia MajorsLCSWA  Date/time:  02/05/2013 12:08 PM  Clinical Social Work is seeking post-discharge placement for this patient at the following level of care:   SKILLED NURSING   (*CSW will update this form in Epic as items are completed)   N[facility] at family request  [facility]family provided with [facility]'s list of facilities offering this level of care within the geographic area requested by the patient (or if unable, by the patient's family).  02/05/2013  Patient/family informed of their freedom to choose among providers that offer the needed level of care, that participate in Medicare, Medicaid or managed care program needed by the patient, have an available bed and are willing to accept the patient.  N/A-not sent to this SNF  Patient/family informed of [facility]' ownership interest in [facility], as well as of the fact that they are under no obligation to receive care at this facility.  PASARR submitted to EDS on EXISTING PASARR number received from EDS on EXISTING  FL2 transmitted to all facilities in geographic area requested by pt/family on  02/05/2013 FL2 transmitted to all facilities within larger geographic area on   Patient informed that his/her managed care company has contracts with or will negotiate with  certain facilities, including the following:     Patient/family informed of bed offers received:  02/06/2013 Patient chooses bed at [facility] Physician recommends and patient chooses bed at    Patient to be transferred to [facility] on   Patient to be transferred to facility by   The following physician request were entered in Epic:   Additional  Comments:   Maryclare LabradorJulie Gavinn Collard, MSW, [facility]WA Clinical Social Worker [phone]

## 2013-02-05 NOTE — Progress Notes (Signed)
Clinical Social Work Department BRIEF PSYCHOSOCIAL ASSESSMENT 02/05/2013  Patient:  [name]     Account Number:  [id number]     Admit date:  02/02/2013  Clinical Social Worker:  Varney BilesANDERSON,Aldous Housel, LCSWA  Date/Time:  02/05/2013 12:02 PM  Referred by:  Physician  Date Referred:  02/05/2013 Referred for  SNF Placement   Other Referral:   Interview type:  Family Other interview type:    PSYCHOSOCIAL DATA Living Status:  FACILITY Admitted from facility:  [name] Level of care:  Skilled Nursing Facility Primary support name:  [name] ([phone]) Primary support relationship to patient:  CHILD, ADULT Degree of support available:   Good--pt was at [name] prior to [facility], and daughter provides support.    CURRENT CONCERNS Current Concerns  Post-Acute Placement   Other Concerns:    SOCIAL WORK ASSESSMENT / PLAN CSW called pt's daughter to complete assessment, as pt is not fully oriented per chart. Daughter [name] explained [name] but that the family is not paying to reserve a bed. However, [name] states facility says they will take pt back when she is ready for discharge. CSW sent clinicals to SNF. [name] thanked CSW for assistance with this.   Assessment/plan status:  Psychosocial Support/Ongoing Assessment of Needs Other assessment/ plan:   Information/referral to community resources:   SNF ([name])    PATIENT'S/FAMILY'S RESPONSE TO PLAN OF CARE: Good--pt's daughter [name] friendly on phone and understanding of CSW role. [name] states [facility] will return to [name] when ready for [facility]. CSW following case and continues to provide support and assist with getting pt to SNF.       Maryclare LabradorJulie Wilkie Zenon, MSW, [name] ClaireCSWA Clinical Social Worker [phone]

## 2013-02-05 NOTE — Progress Notes (Signed)
[place] TEAM 1 - Stepdown/ICU TEAM Progress Note  [name] [id number]:[id number] DOB: [birth year] DOA: 01/29/2013 PCP: No PCP Per Patient  Admit HPI / Brief Narrative: 80 y.o. SNF resident female with history of atrial fib who was brought from the nursing home w/ c/o SOB.  She had been experiencing N/V/D and was observed to aspirate during an episode of vomiting.  She was also found to be in A. fib with RVR.  HPI/Subjective: Pt is alert and interactive, but confused.  She is unable to provide a reliable hx.   Assessment/Plan:  Aspiration Pneumonitis   UTI/Pyelo  Sepsis  N/V/D  C diff negative  Chronic Afib w/ acute RVR On chronic warfarin  Dehydration  Hypothyroidism  Hx of Diastolic CHF  MRSA Screen +  Code Status: DNR Family Communication: no family present at time of exam Disposition Plan: SDU  Consultants: none  Procedures: none  Antibiotics: Zosyn 1/27 > 1/28 Vanc 1/27 > Levaquin 1/28 >  DVT prophylaxis: Warfarin   Objective: Blood pressure 96/53, pulse 78, temperature 99 F (37.2 C), temperature source Oral, resp. rate 26, height 5\' 5"  (1.651 m), weight 63.5 kg (139 lb 15.9 oz), SpO2 100.00%.  Intake/Output Summary (Last 24 hours) at 02/05/13 1256 Last data filed at 02/05/13 1200  Gross per 24 hour  Intake    460 ml  Output    230 ml  Net    230 ml   Exam: F/U exam completed  Data Reviewed: Basic Metabolic Panel:  Recent Labs Lab 01/23/2013 02/05/13 0013 02/05/13 0415  NA 136* 138 139  K 4.8 4.4 4.9  CL 97 101 100  CO2 21  --  21  GLUCOSE 214* 218* 167*  BUN 24* 24* 28*  CREATININE 0.77 0.80 1.04  CALCIUM 8.8  --  9.0   Liver Function Tests:  Recent Labs Lab 01/28/2013  AST 20  ALT 10  ALKPHOS 70  BILITOT 0.4  PROT 7.2  ALBUMIN 2.6*    Recent Labs Lab 01/26/2013  LIPASE 26   CBC:  Recent Labs Lab 02/02/2013 02/05/13 0013 02/05/13 0415  WBC 9.3  --  9.6  NEUTROABS 8.7*  --   --   HGB 10.1* 11.6* 10.1*  HCT  30.5* 34.0* 30.8*  MCV 93.0  --  93.1  PLT 283  --  272   Cardiac Enzymes:  Recent Labs Lab 02/05/13 0415 02/05/13 0915  TROPONINI <0.30 <0.30   BNP (last 3 results)  Recent Labs  11/16/12 0505 11/19/12 0505  PROBNP 2620.0* 3312.0*    Recent Results (from the past 240 hour(s))  MRSA PCR SCREENING     Status: Abnormal   Collection Time    02/05/13  2:14 AM      Result Value Range Status   MRSA by PCR POSITIVE (*) NEGATIVE Final   Comment:            The GeneXpert MRSA Assay (FDA     approved for NASAL specimens     only), is one component of a     comprehensive MRSA colonization     surveillance program. It is not     intended to diagnose MRSA     infection nor to guide or     monitor treatment for     MRSA infections.     RESULT CALLED TO, READ BACK BY AND VERIFIED WITH:     CALLED TO RN TARA TOMLINSON [phone] @0634  THANEY  CLOSTRIDIUM DIFFICILE BY PCR  Status: None   Collection Time    02/05/13 10:51 AM      Result Value Range Status   C difficile by pcr NEGATIVE  NEGATIVE Final     Studies:  Recent x-ray studies have been reviewed in detail by the Attending Physician  Scheduled Meds:  Scheduled Meds: . albuterol  2.5 mg Nebulization Q6H  . antiseptic oral rinse  15 mL Mouth Rinse q12n4p  . chlorhexidine  15 mL Mouth Rinse BID  . Chlorhexidine Gluconate Cloth  6 each Topical Q0600  . levothyroxine  25 mcg Intravenous Daily  . metoprolol  2.5 mg Intravenous Q6H  . mupirocin ointment  1 application Nasal BID  . piperacillin-tazobactam (ZOSYN)  IV  3.375 g Intravenous Q8H  . polyvinyl alcohol  2 drop Both Eyes TID  . sodium chloride  3 mL Intravenous Q12H  . [START ON 02/06/2013] vancomycin  500 mg Intravenous Q24H  . warfarin  2 mg Oral q1800  . Warfarin - Pharmacist Dosing Inpatient   Does not apply q1800    Time spent on care of this patient: 25+ mins   [facility]  [facility] Office  [phone] Pager - Text Page per Loretha Stapler  as per below:  On-Call/Text Page:      [email or web address]      password [email or web address]  If 7PM-7AM, please contact night-coverage [email or web address] Password [email or web address] 02/05/2013, 12:56 PM   LOS: 1 day

## 2013-02-05 NOTE — Progress Notes (Signed)
RT note: pt. Taken off of bipap by RN to [place] 4L, Spo2 94-96%

## 2013-02-05 NOTE — Progress Notes (Signed)
Dr Sharon SellerMcClung notified of large amount liquid, brown, foul-smelling stool. Patient also vomiting same color and smelling brown liquid. Will insert rectal tube per order.

## 2013-02-05 NOTE — ED Notes (Signed)
MD at bedside. (Hospitalist, Dr. Sharyon MedicusHijazi).

## 2013-02-05 NOTE — Progress Notes (Signed)
ANTICOAGULATION and Antibiotic CONSULT NOTE - Initial Consult  Pharmacy Consult for Vancomycin and Zosyn and Coumadin Indication: possible sepsis and Atrial Fibrillation  Allergies  Allergen Reactions  . Boniva [Ibandronic Acid]     Per MAR  . Cymbalta [Duloxetine Hcl]     Per MAR  . Fentanyl     Per MAR  . Lyrica [Pregabalin]     Per MAR    Patient Measurements: Height: 5\' 5"  (165.1 cm) Weight: 139 lb 15.9 oz (63.5 kg) IBW/kg (Calculated) : 57 Vital Signs: Temp: 100.2 F (37.9 C) (01/28 0245) Temp src: Rectal (01/27 2306) BP: 88/36 mmHg (01/28 0245) Pulse Rate: 85 (01/28 0245)  Labs:  Recent Labs  01/19/2013 02/05/13 0013  HGB 10.1* 11.6*  HCT 30.5* 34.0*  PLT 283  --   CREATININE 0.77 0.80    Estimated Creatinine Clearance: 34.5 ml/min (by C-G formula based on Cr of 0.8).   Medical History: Past Medical History  Diagnosis Date  . Chronic atrial fibrillation   . History of GI bleed   . Neuropathy   . Fatigue   . Palpitations   . SOB (shortness of breath)   . OA (osteoarthritis)   . Weakness   . Forgetfulness   . Anxiety   . Depression     Medications:  Prescriptions prior to admission  Medication Sig Dispense Refill  . acetaminophen (TYLENOL) 325 MG tablet Take 650 mg by mouth every 4 (four) hours as needed for mild pain or fever.      . ALPRAZolam (XANAX) 0.25 MG tablet Take 0.25 mg by mouth every 6 (six) hours as needed for anxiety.       Marland Kitchen. antiseptic oral rinse (BIOTENE) LIQD 15 mLs by Mouth Rinse route as needed for dry mouth.      . calcium-vitamin D (OSCAL WITH D) 500-200 MG-UNIT per tablet Take 1 tablet by mouth 2 (two) times daily.        . Coenzyme Q10 200 MG capsule Take 200 mg by mouth 2 (two) times daily.      Marland Kitchen. gabapentin (NEURONTIN) 300 MG capsule Take 300 mg by mouth 2 (two) times daily.      Marland Kitchen. HYDROcodone-acetaminophen (NORCO) 5-325 MG per tablet Take 1 tablet by mouth every 4 (four) hours as needed for moderate pain.       Marland Kitchen.  levothyroxine (SYNTHROID, LEVOTHROID) 75 MCG tablet Take 75 mcg by mouth daily.        Marland Kitchen. omega-3 acid ethyl esters (LOVAZA) 1 G capsule Take 1 g by mouth every evening.      Bertram Gala. Polyethyl Glycol-Propyl Glycol (SYSTANE) 0.4-0.3 % SOLN Place 2 drops into both eyes 3 (three) times daily.      . polyethylene glycol (MIRALAX / GLYCOLAX) packet Take 17 g by mouth 2 (two) times daily. For constipation      . polysaccharide iron (NIFEREX) 150 MG CAPS capsule Take 150 mg by mouth every other day.       . potassium chloride (KLOR-CON) 8 MEQ tablet Take 8 mEq by mouth 2 (two) times daily.      Marland Kitchen. senna-docusate (SENOKOT-S) 8.6-50 MG per tablet Take 1 tablet by mouth daily as needed for mild constipation.      Marland Kitchen. tiotropium (SPIRIVA) 18 MCG inhalation capsule Place 18 mcg into inhaler and inhale daily.       . Vitamin D, Cholecalciferol, 400 UNITS CHEW Chew 1 tablet (400 Units total) by mouth 2 (two) times daily.  60 tablet  3  . warfarin (COUMADIN) 2 MG tablet Take 2 mg by mouth daily.        Assessment: 80 yo female with SOB/aspiration for empiric antibiotics.  H/O AFib to continue Coumadin.  Vancomycin 1 g IV given in ED at 2330  Goal of Therapy:  Vancomycin trough 15-20 INR 2-3 Monitor platelets by anticoagulation protocol: Yes   Plan:  Vancomycin 500 mg IV q24h Zosyn 3.375 g IV q8h F/U daily INR  Jeraldine Primeau, Gary Fleet 02/05/2013,3:11 AM

## 2013-02-05 NOTE — Evaluation (Signed)
Clinical/Bedside Swallow Evaluation Patient Details  Name: [name] MRN: [id number] Date of Birth: 1911/03/05  Today's Date: 02/05/2013 Time: 0950-1007 SLP Time Calculation (min): 17 min  Past Medical History:  Past Medical History  Diagnosis Date  . Chronic atrial fibrillation   . History of GI bleed   . Neuropathy   . Fatigue   . Palpitations   . SOB (shortness of breath)   . OA (osteoarthritis)   . Weakness   . Forgetfulness   . Anxiety   . Depression    Past Surgical History:  Past Surgical History  Procedure Laterality Date  . Umbilical hernia repair    . Appendectomy    . Knee surgery    . Tonsillectomy    . Vaginal hysterectomy    . US echocardiography  08/07/2002    EF 60-65%   HPI:  [name] is a 80 y.o. female, nursing home resident with past medical history significant for atrial fib, GI bleed, who was brought from the nursing home after she had witnessed aspiration and hypoxemia also noted in the ER. Patient was placed on nasal cannula on admission and switched to BiPAP.  Found to have Atrial fib with rapid ventricular rate at 160. Chest x-ray negative per MD. Pt has been seen by SLP service in past, determien to be at moderate risk of aspiration chronically due to respiratory funciton and increased work of breathing particulary with PO intake. Had been on Dys 1/nectar thick liquids during November admission.  RN reports that pts dtr says pt was on a DYs 3 diet and nectar thick liquids at SNF, but dtr had concerns that she was not tolerating. Her SLP therapy had ended.    Assessment / Plan / Recommendation Clinical Impression  Pt would only accept few PO trials. SLP provided oral care. Pt did have immediately impaired respiratory rate due to apneic period during swallow with immediate expiratory wheeze after swallow. She also exhibited wet vocal quality and multiple swallows with immediate throat clear with nectar thick liquids via cup.  Teaspoons of nectar and puree were tolerated well. Recommend Dys 1/honey for now. Will discuss question of MBS with dtr. Pt is likely to have ongoing risk despite deit modifications due to respiratory compromise, however perhaps a less restrictive diet could be agreed upon for quality of life. Will continue to follow.     Aspiration Risk  Moderate    Diet Recommendation Honey-thick liquid;Dysphagia 1 (Puree)   Liquid Administration via: Spoon Medication Administration: Whole meds with puree Supervision: Staff to assist with self feeding Compensations: Slow rate;Small sips/bites Postural Changes and/or Swallow Maneuvers: Seated upright 90 degrees    Other  Recommendations Oral Care Recommendations: Oral care BID Other Recommendations: Order thickener from pharmacy   Follow Up Recommendations  Skilled Nursing facility    Frequency and Duration min 2x/week  2 weeks   Pertinent Vitals/Pain NA    SLP Swallow Goals     Swallow Study Prior Functional Status       General HPI: [name] is a 80 y.o. female, nursing home resident with past medical history significant for atrial fib, GI bleed, who was brought from the nursing home after she had witnessed aspiration and hypoxemia also noted in the ER. Patient was placed on nasal cannula on admission and switched to BiPAP.  Found to have Atrial fib with rapid ventricular rate at 160. Chest x-ray negative per MD. Pt has been seen by SLP service in past, determien to be  at moderate risk of aspiration chronically due to respiratory funciton and increased work of breathing particulary with PO intake. Had been on Dys 1/nectar thick liquids during November admission.  RN reports that pts dtr says pt was on a DYs 3 diet and nectar thick liquids at SNF, but dtr had concerns that she was not tolerating. Her SLP therapy had ended.  Type of Study: Bedside swallow evaluation Previous Swallow Assessment: see HPI Diet Prior to this Study:  NPO Temperature Spikes Noted: No Respiratory Status: Nasal cannula History of Recent Intubation: No Behavior/Cognition: Alert;Cooperative;Requires cueing Oral Cavity - Dentition: Edentulous (dentures at bedside) Self-Feeding Abilities: Needs assist Patient Positioning: Upright in bed Baseline Vocal Quality: Clear Volitional Cough: Strong Volitional Swallow: Able to elicit    Oral/Motor/Sensory Function Overall Oral Motor/Sensory Function: Appears within functional limits for tasks assessed   Ice Chips Ice chips: Not tested   Thin Liquid Thin Liquid: Not tested    Nectar Thick Nectar Thick Liquid: Impaired Presentation: Spoon;Cup Pharyngeal Phase Impairments: Wet Vocal Quality;Multiple swallows;Throat Clearing - Immediate;Suspected delayed Swallow   Honey Thick Honey Thick Liquid: Within functional limits Presentation: Spoon   Puree Puree: Within functional limits Presentation: Spoon   Solid   GO    Solid: Not tested      Harlon DittyBonnie Koltan Portocarrero, MA CCC-SLP [phone]  Derra Shartzer, Riley NearingBonnie Caroline 02/05/2013,10:58 AM

## 2013-02-06 ENCOUNTER — Inpatient Hospital Stay (HOSPITAL_COMMUNITY): Payer: Medicare Other

## 2013-02-06 DIAGNOSIS — K922 Gastrointestinal hemorrhage, unspecified: Secondary | ICD-10-CM

## 2013-02-06 DIAGNOSIS — J690 Pneumonitis due to inhalation of food and vomit: Secondary | ICD-10-CM

## 2013-02-06 DIAGNOSIS — I509 Heart failure, unspecified: Secondary | ICD-10-CM

## 2013-02-06 LAB — HEMOGLOBIN AND HEMATOCRIT, BLOOD
HCT: 23 % — ABNORMAL LOW (ref 36.0–46.0)
Hemoglobin: 7.8 g/dL — ABNORMAL LOW (ref 12.0–15.0)

## 2013-02-06 LAB — CBC
HCT: 26.7 % — ABNORMAL LOW (ref 36.0–46.0)
HCT: 26.1 % — ABNORMAL LOW (ref 36.0–46.0)
HEMATOCRIT: 22.5 % — AB (ref 36.0–46.0)
HEMOGLOBIN: 7.4 g/dL — AB (ref 12.0–15.0)
Hemoglobin: 8.6 g/dL — ABNORMAL LOW (ref 12.0–15.0)
Hemoglobin: 9 g/dL — ABNORMAL LOW (ref 12.0–15.0)
MCH: 29.7 pg (ref 26.0–34.0)
MCH: 30.1 pg (ref 26.0–34.0)
MCH: 30.9 pg (ref 26.0–34.0)
MCHC: 32.2 g/dL (ref 30.0–36.0)
MCHC: 32.9 g/dL (ref 30.0–36.0)
MCHC: 34.5 g/dL (ref 30.0–36.0)
MCV: 92.1 fL (ref 78.0–100.0)
MCV: 91.5 fL (ref 78.0–100.0)
MCV: 89.7 fL (ref 78.0–100.0)
Platelets: 237 10*3/uL (ref 150–400)
Platelets: 252 10*3/uL (ref 150–400)
Platelets: 246 10*3/uL (ref 150–400)
RBC: 2.9 MIL/uL — AB (ref 3.87–5.11)
RBC: 2.46 MIL/uL — ABNORMAL LOW (ref 3.87–5.11)
RBC: 2.91 MIL/uL — ABNORMAL LOW (ref 3.87–5.11)
RDW: 16.8 % — AB (ref 11.5–15.5)
RDW: 17.1 % — ABNORMAL HIGH (ref 11.5–15.5)
RDW: 16.6 % — AB (ref 11.5–15.5)
WBC: 11.2 10*3/uL — ABNORMAL HIGH (ref 4.0–10.5)
WBC: 10.7 10*3/uL — ABNORMAL HIGH (ref 4.0–10.5)
WBC: 10.7 10*3/uL — AB (ref 4.0–10.5)

## 2013-02-06 LAB — COMPREHENSIVE METABOLIC PANEL
ALBUMIN: 2.2 g/dL — AB (ref 3.5–5.2)
ALK PHOS: 42 U/L (ref 39–117)
ALT: 11 U/L (ref 0–35)
AST: 19 U/L (ref 0–37)
BUN: 83 mg/dL — AB (ref 6–23)
CHLORIDE: 100 meq/L (ref 96–112)
CO2: 22 meq/L (ref 19–32)
CREATININE: 1.24 mg/dL — AB (ref 0.50–1.10)
Calcium: 8.4 mg/dL (ref 8.4–10.5)
GFR calc Af Amer: 40 mL/min — ABNORMAL LOW (ref >90)
GFR, EST NON AFRICAN AMERICAN: 35 mL/min — AB (ref >90)
Glucose, Bld: 105 mg/dL — ABNORMAL HIGH (ref 70–99)
Potassium: 5 mEq/L (ref 3.7–5.3)
Sodium: 136 mEq/L — ABNORMAL LOW (ref 137–147)
Total Bilirubin: 0.3 mg/dL (ref 0.3–1.2)
Total Protein: 6.2 g/dL (ref 6.0–8.3)

## 2013-02-06 LAB — URINE CULTURE
COLONY COUNT: NO GROWTH
Culture: NO GROWTH
Special Requests: NORMAL

## 2013-02-06 LAB — LACTIC ACID, PLASMA: Lactic Acid, Venous: 1.8 mmol/L (ref 0.5–2.2)

## 2013-02-06 LAB — PROTIME-INR
INR: 4.63 — AB (ref 0.00–1.49)
INR: 3.19 — AB (ref 0.00–1.49)
Prothrombin Time: 41.9 seconds — ABNORMAL HIGH (ref 11.6–15.2)
Prothrombin Time: 31.5 seconds — ABNORMAL HIGH (ref 11.6–15.2)

## 2013-02-06 LAB — PREPARE RBC (CROSSMATCH)

## 2013-02-06 LAB — OCCULT BLOOD X 1 CARD TO LAB, STOOL: FECAL OCCULT BLD: POSITIVE — AB

## 2013-02-06 LAB — SODIUM, URINE, RANDOM: Sodium, Ur: 24 mEq/L

## 2013-02-06 MED ORDER — KCL IN DEXTROSE-NACL 20-5-0.9 MEQ/L-%-% IV SOLN: INTRAVENOUS | Status: DC

## 2013-02-06 MED ORDER — VITAMIN K1 10 MG/ML IJ SOLN
10.0000 mg | Freq: Once | INTRAVENOUS | Status: AC
  Administered 2013-02-06: 10 mg via INTRAVENOUS
  Filled 2013-02-06: qty 1

## 2013-02-06 MED ORDER — FUROSEMIDE 10 MG/ML IJ SOLN
40.0000 mg | Freq: Two times a day (BID) | INTRAMUSCULAR | Status: DC
  Administered 2013-02-06: 40 mg via INTRAVENOUS
  Filled 2013-02-06 (×2): qty 4

## 2013-02-06 MED ORDER — BENZONATATE 100 MG PO CAPS
100.0000 mg | ORAL_CAPSULE | Freq: Three times a day (TID) | ORAL | Status: DC
  Administered 2013-02-07 (×2): 100 mg via ORAL
  Filled 2013-02-06 (×8): qty 1

## 2013-02-06 MED ORDER — METRONIDAZOLE IN NACL 5-0.79 MG/ML-% IV SOLN
500.0000 mg | Freq: Three times a day (TID) | INTRAVENOUS | Status: DC
  Administered 2013-02-07 – 2013-02-08 (×5): 500 mg via INTRAVENOUS
  Filled 2013-02-06 (×8): qty 100

## 2013-02-06 MED ORDER — SODIUM CHLORIDE 0.9 % IV SOLN
80.0000 mg | Freq: Once | INTRAVENOUS | Status: AC
  Administered 2013-02-06: 80 mg via INTRAVENOUS
  Filled 2013-02-06: qty 80

## 2013-02-06 MED ORDER — ALBUTEROL SULFATE (2.5 MG/3ML) 0.083% IN NEBU
2.5000 mg | INHALATION_SOLUTION | Freq: Once | RESPIRATORY_TRACT | Status: AC
  Administered 2013-02-06: 2.5 mg via RESPIRATORY_TRACT
  Filled 2013-02-06: qty 3

## 2013-02-06 MED ORDER — SODIUM CHLORIDE 0.9 % IV SOLN
8.0000 mg/h | INTRAVENOUS | Status: AC
  Administered 2013-02-06 – 2013-02-08 (×6): 8 mg/h via INTRAVENOUS
  Filled 2013-02-06 (×14): qty 80

## 2013-02-06 MED ORDER — GI COCKTAIL ~~LOC~~: 30.0000 mL | Freq: Three times a day (TID) | ORAL | Status: DC | PRN

## 2013-02-06 MED ORDER — VITAMIN K1 10 MG/ML IJ SOLN
2.0000 mg | Freq: Once | INTRAVENOUS | Status: DC
  Filled 2013-02-06: qty 0.2

## 2013-02-06 NOTE — Progress Notes (Signed)
ANTICOAGULATION CONSULT NOTE - Follow Up Consult  Pharmacy Consult for Coumadin Indication: atrial fibrillation  Allergies  Allergen Reactions  . Boniva [Ibandronic Acid]     Per MAR  . Cymbalta [Duloxetine Hcl]     Per MAR  . Fentanyl     Per MAR  . Lyrica [Pregabalin]     Per MAR    Patient Measurements: Height: 5\' 5"  (165.1 cm) Weight: 139 lb 15.9 oz (63.5 kg) IBW/kg (Calculated) : 57  Vital Signs: Temp: 98.2 F (36.8 C) (01/29 0800) Temp src: Core (Comment) (01/29 0418) BP: 131/66 mmHg (01/29 0800) Pulse Rate: 109 (01/29 0800)  Labs:  Recent Labs  Nov 19, 2013 02/05/13 0013 02/05/13 0415 02/05/13 0915 02/05/13 1350 02/06/13 0240 02/06/13 0850  HGB 10.1* 11.6* 10.1*  --   --  8.6* 7.8*  HCT 30.5* 34.0* 30.8*  --   --  26.7* 23.0*  PLT 283  --  272  --   --  237  --   LABPROT  --   --  28.7*  --   --  41.9* 31.5*  INR  --   --  2.82*  --   --  4.63* 3.19*  CREATININE 0.77 0.80 1.04  --   --  1.24*  --   TROPONINI  --   --  <0.30 <0.30 <0.30  --   --     Estimated Creatinine Clearance: 22.2 ml/min (by C-G formula based on Cr of 1.24).   Medications:  Scheduled:  . antiseptic oral rinse  15 mL Mouth Rinse q12n4p  . chlorhexidine  15 mL Mouth Rinse BID  . Chlorhexidine Gluconate Cloth  6 each Topical Q0600  . levofloxacin (LEVAQUIN) IV  750 mg Intravenous Q48H  . levothyroxine  50 mcg Oral QAC breakfast  . metoprolol  2.5 mg Intravenous Q6H  . mupirocin ointment  1 application Nasal BID  . polyvinyl alcohol  2 drop Both Eyes TID  . sodium chloride  3 mL Intravenous Q12H  . vancomycin  500 mg Intravenous Q24H  . Warfarin - Pharmacist Dosing Inpatient   Does not apply q1800   Infusions:  . dextrose 5 % and 0.45% NaCl 75 mL/hr (02/06/13 0959)  . diltiazem (CARDIZEM) infusion 5 mg/hr (02/05/13 0013)  . diltiazem (CARDIZEM) infusion Stopped (02/05/13 0300)  . pantoprozole (PROTONIX) infusion Stopped (02/06/13 [id number])    Assessment: 80 yo F on Coumadin PTA  for hx of afib.  Pt was noted to have melena overnight with acute Hgb drop 11.6 > 7.8 and INR increased to 4.6.  Patient was started on Protonix infusion and received 10mg  IV Vitamin K.  INR is showing downward trend in response to Vit K.  Goal of Therapy:  INR 2-3 Monitor platelets by anticoagulation protocol: Yes   Plan:  No Coumadin tonight - standing order has been discontinued. Follow up bleeding and decision on restarting anticoagulation when stable.  Toys 'R' UsKimberly Jeet Shough, Pharm.D., BCPS Clinical Pharmacist Pager [phone] 02/06/2013 10:23 AM

## 2013-02-06 NOTE — Progress Notes (Signed)
SLP Cancellation Note  Patient Details Name: Elroy Channellizabeth W Mutch MRN: [id number] DOB: 05/28/1911   Cancelled treatment:       Reason Eval/Treat Not Completed: Medical issues which prohibited therapy. Pt with report of melena, made NPO. Will f/u for dysphagia when appropriate.   Harlon DittyBonnie Briselda Naval, KentuckyMA CCC-SLP [phone]  Claudine MoutonDeBlois, Jhada Risk Caroline 02/06/2013, 7:41 AM

## 2013-02-06 NOTE — Progress Notes (Addendum)
[facility] PROGRESS NOTE  [name]  [id number]  DOB: [birth year]  DOS: 02/06/2013  PCP: No PCP Per Patient  Subjective: I was called as the nurse reported the patient had black color bowel movement. On my evaluation the patient did complain of her breathing as well as some pain in her stomach. She denies any complaint.  Objective: Blood pressure 110/54, heart rate 100 sinus, saturation 100% Alert and awake Bilateral crackles and rhonchi S1-S2 present Bowel sounds present soft no guarding no rigidity no obvious tenderness.  Assessment and Plan: 1) melena Check CBC, INR, CMP X-ray abdomen Check lactic acid level  IV Protonix bolus 80 mg x1 N.p.o. Further workup based on the results of lab.  Author: Lynden OxfordPranav Kylee Nardozzi, MD [facility] Pager: [phone] 02/06/2013 2:42 AM   If 7PM-7AM, please contact night-coverage at [email or web address], password [email or web address]  Addendum 3:33 AM  Hemoglobin has dropped to 8.6 and INR is 4.6 Continue Protonix drip N.p.o. IV vitamin K Repeat INR and hemoglobin within 6 hours.  Griffon Herberg

## 2013-02-06 NOTE — Progress Notes (Addendum)
[place] TEAM 1 - Stepdown/ICU TEAM Progress Note  [name] NWG:[id number] DOB: 10-Dec-1911 DOA: 2013-03-03 PCP: No PCP Per Patient  Admit HPI / Brief Narrative: 80 y.o. SNF resident female with history of atrial fib who was brought from the nursing home w/ c/o SOB.  She had been experiencing N/V/D and was observed to aspirate during an episode of vomiting.  She was also found to be in A. fib with RVR.  HPI/Subjective: Pt is alert and interactive, but confused.  Complains of a sore throat. Noted to have melena last. No c/o abdominal pain.    Assessment/Plan: Acute respiratory failure (a) Aspiration Pneumonitis  - still requiring 5 L O2 but pusle ox 99%- will try to taper O2 - add Flagyl to Vanc and Levaquin - per SLP will need honey thick liqiuds  (b) Pulmonary edema?Hx of Diastolic CHF - ECHO reveals clear cut right heart failure and although it mentions mod LVH, diastolic function is difficult to evalute - cont to diurese for now - obtain Urine Na+ level  Upper GI bleed - started on IV Protonix - f/u H/H closely-  - transfuse 1 U PRBC and 3 U FFP -  d/c Coumadin- Vit K given - I left a message for daughter to see if she would like pt to have an endoscopy  UTI - culture negative- will continue to treat for 3 days  Sepsis - due to PNA and UTI  N/V/D  - resolved - C diff negative  Chronic Afib w/ acute RVR - HR stable on IV Lopressor On chronic warfarin  Hypothyroidism - cont synthroid   MRSA Screen +  Code Status: DNR Family Communication: no family present at time of exam- called and left message for daughter Disposition Plan: SDU  Consultants: none  Procedures: none  Antibiotics: Zosyn 1/27 > 1/28 Vanc 1/27 > Levaquin 1/28 >  DVT prophylaxis: Warfarin   Objective: Blood pressure 99/81, pulse 99, temperature 97.5 F (36.4 C), temperature source Oral, resp. rate 20, height 5\' 5"  (1.651 m), weight 63.5 kg (139 lb 15.9 oz), SpO2  99.00%.  Intake/Output Summary (Last 24 hours) at 02/06/13 1445 Last data filed at 02/06/13 1100  Gross per 24 hour  Intake 1444.67 ml  Output    350 ml  Net 1094.67 ml   Exam: General: awake and confused, No acute respiratory distress Lungs: cough, crackles at bases- upper airway congestion noted Cardiovascular: Irregular rate and rhythm without murmur gallop or rub normal S1 and S2 Abdomen: Nontender, nondistended, soft, bowel sounds positive, no rebound, no ascites, no appreciable mass Extremities: No significant cyanosis, clubbing, or edema bilateral lower extremities   Data Reviewed: Basic Metabolic Panel:  Recent Labs Lab March 03, 2013 02/05/13 0013 02/05/13 0415 02/06/13 0240  NA 136* 138 139 136*  K 4.8 4.4 4.9 5.0  CL 97 101 100 100  CO2 21  --  21 22  GLUCOSE 214* 218* 167* 105*  BUN 24* 24* 28* 83*  CREATININE 0.77 0.80 1.04 1.24*  CALCIUM 8.8  --  9.0 8.4   Liver Function Tests:  Recent Labs Lab March 03, 2013 02/06/13 0240  AST 20 19  ALT 10 11  ALKPHOS 70 42  BILITOT 0.4 0.3  PROT 7.2 6.2  ALBUMIN 2.6* 2.2*    Recent Labs Lab Mar 03, 2013  LIPASE 26   CBC:  Recent Labs Lab 2013/03/03 02/05/13 0013 02/05/13 0415 02/06/13 0240 02/06/13 0850 02/06/13 1300  WBC 9.3  --  9.6 11.2*  --  10.7*  NEUTROABS 8.7*  --   --   --   --   --   HGB 10.1* 11.6* 10.1* 8.6* 7.8* 7.4*  HCT 30.5* 34.0* 30.8* 26.7* 23.0* 22.5*  MCV 93.0  --  93.1 92.1  --  91.5  PLT 283  --  272 237  --  252   Cardiac Enzymes:  Recent Labs Lab 02/05/13 0415 02/05/13 0915 02/05/13 1350  TROPONINI <0.30 <0.30 <0.30   BNP (last 3 results)  Recent Labs  11/16/12 0505 11/19/12 0505  PROBNP 2620.0* 3312.0*    Recent Results (from the past 240 hour(s))  CULTURE, BLOOD (ROUTINE X 2)     Status: None   Collection Time    09/15/2013 10:40 PM      Result Value Range Status   Specimen Description BLOOD ARM LEFT   Final   Special Requests BOTTLES DRAWN AEROBIC ONLY 6CC   Final    Culture  Setup Time     Final   Value: 02/05/2013 08:25     Performed at [facility]   Culture     Final   Value:        BLOOD CULTURE RECEIVED NO GROWTH TO DATE CULTURE WILL BE HELD FOR 5 DAYS BEFORE ISSUING A FINAL NEGATIVE REPORT     Performed at [facility]   Report Status PENDING   Incomplete  CULTURE, BLOOD (ROUTINE X 2)     Status: None   Collection Time    09/15/2013 10:45 PM      Result Value Range Status   Specimen Description BLOOD HAND LEFT   Final   Special Requests BOTTLES DRAWN AEROBIC ONLY 10CC   Final   Culture  Setup Time     Final   Value: 02/05/2013 08:25     Performed at [facility]   Culture     Final   Value:        BLOOD CULTURE RECEIVED NO GROWTH TO DATE CULTURE WILL BE HELD FOR 5 DAYS BEFORE ISSUING A FINAL NEGATIVE REPORT     Performed at [facility]   Report Status PENDING   Incomplete  URINE CULTURE     Status: None   Collection Time    09/15/2013 11:12 PM      Result Value Range Status   Specimen Description URINE, CATHETERIZED   Final   Special Requests Normal   Final   Culture  Setup Time     Final   Value: 02/05/2013 08:40     Performed at [facility]   Colony Count     Final   Value: NO GROWTH     Performed at [facility]   Culture     Final   Value: NO GROWTH     Performed at [facility]   Report Status 02/06/2013 FINAL   Final  MRSA PCR SCREENING     Status: Abnormal   Collection Time    02/05/13  2:14 AM      Result Value Range Status   MRSA by PCR POSITIVE (*) NEGATIVE Final   Comment:            The GeneXpert MRSA Assay (FDA     approved for NASAL specimens     only), is one component of a     comprehensive MRSA colonization     surveillance program. It is not     intended to diagnose MRSA     infection  nor to guide or     monitor treatment for     MRSA infections.     RESULT CALLED TO, READ BACK BY AND VERIFIED WITH:     CALLED TO RN Rocco Pauls [phone] @0634  THANEY   CLOSTRIDIUM DIFFICILE BY PCR     Status: None   Collection Time    02/05/13 10:51 AM      Result Value Range Status   C difficile by pcr NEGATIVE  NEGATIVE Final     Studies:  Recent x-ray studies have been reviewed in detail by the Attending Physician  Scheduled Meds:  Scheduled Meds: . antiseptic oral rinse  15 mL Mouth Rinse q12n4p  . benzonatate  100 mg Oral TID  . chlorhexidine  15 mL Mouth Rinse BID  . Chlorhexidine Gluconate Cloth  6 each Topical Q0600  . furosemide  40 mg Intravenous BID  . levofloxacin (LEVAQUIN) IV  750 mg Intravenous Q48H  . levothyroxine  50 mcg Oral QAC breakfast  . metoprolol  2.5 mg Intravenous Q6H  . mupirocin ointment  1 application Nasal BID  . polyvinyl alcohol  2 drop Both Eyes TID  . sodium chloride  3 mL Intravenous Q12H  . vancomycin  500 mg Intravenous Q24H    Time spent on care of this patient: > 35+ mins   Calvert Cantor, MD  [facility] Office  [phone] Pager - Text Page per Loretha Stapler as per below:  On-Call/Text Page:      [email or web address]      password [email or web address]  If 7PM-7AM, please contact night-coverage [email or web address] Password [email or web address] 02/06/2013, 2:45 PM   LOS: 2 days

## 2013-02-07 DIAGNOSIS — D649 Anemia, unspecified: Secondary | ICD-10-CM

## 2013-02-07 LAB — CBC
HCT: 23.5 % — ABNORMAL LOW (ref 36.0–46.0)
HCT: 26.1 % — ABNORMAL LOW (ref 36.0–46.0)
HEMATOCRIT: 25 % — AB (ref 36.0–46.0)
HEMOGLOBIN: 8.5 g/dL — AB (ref 12.0–15.0)
HEMOGLOBIN: 8.8 g/dL — AB (ref 12.0–15.0)
Hemoglobin: 8.1 g/dL — ABNORMAL LOW (ref 12.0–15.0)
MCH: 30.9 pg (ref 26.0–34.0)
MCH: 30.6 pg (ref 26.0–34.0)
MCH: 30.3 pg (ref 26.0–34.0)
MCHC: 34.5 g/dL (ref 30.0–36.0)
MCHC: 34 g/dL (ref 30.0–36.0)
MCHC: 33.7 g/dL (ref 30.0–36.0)
MCV: 89.7 fL (ref 78.0–100.0)
MCV: 89.9 fL (ref 78.0–100.0)
MCV: 90 fL (ref 78.0–100.0)
PLATELETS: 206 10*3/uL (ref 150–400)
Platelets: 232 10*3/uL (ref 150–400)
Platelets: 232 10*3/uL (ref 150–400)
RBC: 2.62 MIL/uL — ABNORMAL LOW (ref 3.87–5.11)
RBC: 2.78 MIL/uL — ABNORMAL LOW (ref 3.87–5.11)
RBC: 2.9 MIL/uL — ABNORMAL LOW (ref 3.87–5.11)
RDW: 16.7 % — AB (ref 11.5–15.5)
RDW: 16.8 % — AB (ref 11.5–15.5)
RDW: 16.7 % — ABNORMAL HIGH (ref 11.5–15.5)
WBC: 8 10*3/uL (ref 4.0–10.5)
WBC: 10.3 10*3/uL (ref 4.0–10.5)
WBC: 10.2 10*3/uL (ref 4.0–10.5)

## 2013-02-07 LAB — TYPE AND SCREEN
ABO/RH(D): O POS
ANTIBODY SCREEN: NEGATIVE
Unit division: 0

## 2013-02-07 LAB — BASIC METABOLIC PANEL
BUN: 79 mg/dL — AB (ref 6–23)
CHLORIDE: 105 meq/L (ref 96–112)
CO2: 25 mEq/L (ref 19–32)
CREATININE: 1.15 mg/dL — AB (ref 0.50–1.10)
Calcium: 8.7 mg/dL (ref 8.4–10.5)
GFR calc Af Amer: 44 mL/min — ABNORMAL LOW (ref >90)
GFR calc non Af Amer: 38 mL/min — ABNORMAL LOW (ref >90)
Glucose, Bld: 101 mg/dL — ABNORMAL HIGH (ref 70–99)
Potassium: 4.5 mEq/L (ref 3.7–5.3)
Sodium: 146 mEq/L (ref 137–147)

## 2013-02-07 LAB — PROTIME-INR
INR: 1.57 — AB (ref 0.00–1.49)
Prothrombin Time: 18.3 seconds — ABNORMAL HIGH (ref 11.6–15.2)

## 2013-02-07 MED ORDER — SODIUM CHLORIDE 0.9 % IV SOLN
INTRAVENOUS | Status: DC | PRN
  Administered 2013-02-07: 19:00:00 via INTRAVENOUS

## 2013-02-07 MED ORDER — FUROSEMIDE 10 MG/ML IJ SOLN
40.0000 mg | Freq: Four times a day (QID) | INTRAMUSCULAR | Status: DC
  Administered 2013-02-07 – 2013-02-08 (×5): 40 mg via INTRAVENOUS
  Filled 2013-02-07 (×6): qty 4

## 2013-02-07 MED ORDER — FUROSEMIDE 10 MG/ML IJ SOLN
40.0000 mg | Freq: Four times a day (QID) | INTRAMUSCULAR | Status: DC
Start: 2013-02-07 — End: 2013-02-07

## 2013-02-07 NOTE — Progress Notes (Signed)
Speech Language Pathology Treatment: Dysphagia  Patient Details Name: Elroy Channellizabeth W Hornberger MRN: [id number] DOB: [birth year] Today's Date: 02/07/2013 Time: [id number] SLP Time Calculation (min): 20 min   Clinical Impression  Patient seen to address dysphagia goals. SLP performed BSE on 1/28 and was placed on Dys 1, Honey liquids diet. She was made NPO on 1/29 secondary to melena with concern for possible GI bleed. MD has contacted family regarding whether they would permit an Endoscopy or if they are wishing to take more palliative measures. Patient accepted a minimal amount of puree solid and honey thick liquid boluses, and continues with confusion, expressive aphasia. Patient presented with delayed oral phase and pharyngeal phase of swallow as per this session, with deceased laryngeal elevation per palpation. Patient accepted boluses when presented, had difficulty sustaining attention and did not appear to be fully aware/engaged in taking PO's. If patient will be receiving Palliative care, recommend Dys 1 (puree) Honey thick liquids for comfort measures. If patient's family wishes to be more aggressive with her care, recommend MBSS prior to initiating PO's, and continue NPO except meds crushed in puree.   HPI HPI: Theodosia Quaylizabeth Shawn is a 80 y.o. female, nursing home resident with past medical history significant for atrial fib, GI bleed, who was brought from the nursing home after she had witnessed aspiration and hypoxemia also noted in the ER. Patient was placed on nasal cannula on admission and switched to BiPAP.  Found to have Atrial fib with rapid ventricular rate at 160. Chest x-ray negative per MD. Pt has been seen by SLP service in past, determien to be at moderate risk of aspiration chronically due to respiratory funciton and increased work of breathing particulary with PO intake. Had been on Dys 1/nectar thick liquids during November admission.  RN reports that pts dtr says pt was on a DYs 3  diet and nectar thick liquids at SNF, but dtr had concerns that she was not tolerating. Her SLP therapy had ended.    Pertinent Vitals   SLP Plan  Other (Comment) (Palliative/comfort PO's vs. MBSS to determine  diet consistency )    Recommendations Diet recommendations: NPO (NPO except meds crushed in puree) Medication Administration: Crushed with puree              Follow up Recommendations: Skilled Nursing facility Plan: Other (Comment) (Palliative/comfort PO's vs. MBSS to determine  diet consistency )    GO     Pablo Lawrencereston, Sheera Illingworth Tarrell 02/07/2013, 1:58 PM  Angela NevinJohn T. Melitza Metheny, MA, CCC-SLP [facility] Speech-Language Pathologist

## 2013-02-07 NOTE — Progress Notes (Signed)
[place] TEAM 1 - Stepdown/ICU TEAM Progress Note  [name] ONG:[id number] DOB: 1911-02-17 DOA: 01/14/2013 PCP: No PCP Per Patient  Admit HPI / Brief Narrative: 80 y.o. SNF resident female with history of atrial fib who was brought from the nursing home w/ c/o SOB.  She had been experiencing N/V/D and was observed to aspirate during an episode of vomiting.  She was also found to be in A. fib with RVR.  HPI/Subjective: Pt is alert and interactive, but confused.  Complains of a sore throat. Noted to have melena last. No c/o abdominal pain.    Assessment/Plan: Acute respiratory failure (a) Aspiration Pneumonitis  - still requiring 5 L O2 but pusle ox 99%- will try to taper O2 - add Flagyl to Vanc and Levaquin - per SLP will need honey thick liquids holding POs due to GI bleed  (b) Pulmonary edema? Hx of Diastolic CHF - ECHO reveals clear cut right heart failure and although it mentions mod LVH, diastolic function is difficult to evalute - cont to diurese for now -  Urine Na+ low at 24- patient actually in positive balance- repeat CXR in AM  Upper GI bleed - started on IV Protonix - f/u H/H closely- bleeding appears to have abated for now - transfused 1 U PRBC and 3 U FFP -  d/c'd Coumadin- Vit K given  Coagulopathy due to Coumadin - INR improved to 1.57 today-   UTI - culture negative- on IV antibiotics already  Sepsis - due to PNA and UTI - resolving  N/V/D  - resolved - C diff negative  Chronic Afib w/ acute RVR - HR stable on IV Lopressor - On chronic warfarin which is on hold  Hypothyroidism - cont synthroid   MRSA Screen +  Code Status: DNR Family Communication: d/w daughter [name] and son Valerie Hua today Disposition Plan: SDU- palliative care consult  Consultants: none  Procedures: none  Antibiotics: Zosyn 1/27 > 1/28 Vanc 1/27 > Levaquin 1/28 > Flagyl>> 1/29  DVT prophylaxis: SCDs  Objective: Blood pressure 138/80, pulse 89,  temperature 98.8 F (37.1 C), temperature source Core (Comment), resp. rate 14, height 5\' 5"  (1.651 m), weight 63.5 kg (139 lb 15.9 oz), SpO2 92.00%.  Intake/Output Summary (Last 24 hours) at 02/07/13 1430 Last data filed at 02/07/13 1100  Gross per 24 hour  Intake 2946.92 ml  Output   2000 ml  Net 946.92 ml   Exam: General: awake and confused, No acute respiratory distress Lungs: cough, crackles at bases- upper airway congestion resolved for now Cardiovascular: Irregular rate and rhythm without murmur gallop or rub normal S1 and S2 Abdomen: Nontender, nondistended, soft, bowel sounds positive, no rebound, no ascites, no appreciable mass Extremities: No significant cyanosis, clubbing, or edema bilateral lower extremities   Data Reviewed: Basic Metabolic Panel:  Recent Labs Lab 01/23/2013 02/05/13 0013 02/05/13 0415 02/06/13 0240 02/07/13 0245  NA 136* 138 139 136* 146  K 4.8 4.4 4.9 5.0 4.5  CL 97 101 100 100 105  CO2 21  --  21 22 25   GLUCOSE 214* 218* 167* 105* 101*  BUN 24* 24* 28* 83* 79*  CREATININE 0.77 0.80 1.04 1.24* 1.15*  CALCIUM 8.8  --  9.0 8.4 8.7   Liver Function Tests:  Recent Labs Lab 01/26/2013 02/06/13 0240  AST 20 19  ALT 10 11  ALKPHOS 70 42  BILITOT 0.4 0.3  PROT 7.2 6.2  ALBUMIN 2.6* 2.2*    Recent Labs Lab 02/06/2013  LIPASE  26   CBC:  Recent Labs Lab 02/03/2013  02/06/13 0240 02/06/13 0850 02/06/13 1300 02/06/13 2113 02/07/13 0245 02/07/13 1202  WBC 9.3  < > 11.2*  --  10.7* 10.7* 8.0 10.3  NEUTROABS 8.7*  --   --   --   --   --   --   --   HGB 10.1*  < > 8.6* 7.8* 7.4* 9.0* 8.1* 8.5*  HCT 30.5*  < > 26.7* 23.0* 22.5* 26.1* 23.5* 25.0*  MCV 93.0  < > 92.1  --  91.5 89.7 89.7 89.9  PLT 283  < > 237  --  252 246 206 232  < > = values in this interval not displayed. Cardiac Enzymes:  Recent Labs Lab 02/05/13 0415 02/05/13 0915 02/05/13 1350  TROPONINI <0.30 <0.30 <0.30   BNP (last 3 results)  Recent Labs  11/16/12 0505  11/19/12 0505  PROBNP 2620.0* 3312.0*    Recent Results (from the past 240 hour(s))  CULTURE, BLOOD (ROUTINE X 2)     Status: None   Collection Time    02/02/2013 10:40 PM      Result Value Range Status   Specimen Description BLOOD ARM LEFT   Final   Special Requests BOTTLES DRAWN AEROBIC ONLY 6CC   Final   Culture  Setup Time     Final   Value: 02/05/2013 08:25     Performed at [facility]   Culture     Final   Value:        BLOOD CULTURE RECEIVED NO GROWTH TO DATE CULTURE WILL BE HELD FOR 5 DAYS BEFORE ISSUING A FINAL NEGATIVE REPORT     Performed at [facility]   Report Status PENDING   Incomplete  CULTURE, BLOOD (ROUTINE X 2)     Status: None   Collection Time    01/15/2013 10:45 PM      Result Value Range Status   Specimen Description BLOOD HAND LEFT   Final   Special Requests BOTTLES DRAWN AEROBIC ONLY 10CC   Final   Culture  Setup Time     Final   Value: 02/05/2013 08:25     Performed at [facility]   Culture     Final   Value:        BLOOD CULTURE RECEIVED NO GROWTH TO DATE CULTURE WILL BE HELD FOR 5 DAYS BEFORE ISSUING A FINAL NEGATIVE REPORT     Performed at [facility]   Report Status PENDING   Incomplete  URINE CULTURE     Status: None   Collection Time    01/16/2013 11:12 PM      Result Value Range Status   Specimen Description URINE, CATHETERIZED   Final   Special Requests Normal   Final   Culture  Setup Time     Final   Value: 02/05/2013 08:40     Performed at [facility] Count     Final   Value: NO GROWTH     Performed at [facility]   Culture     Final   Value: NO GROWTH     Performed at [facility]   Report Status 02/06/2013 FINAL   Final  MRSA PCR SCREENING     Status: Abnormal   Collection Time    02/05/13  2:14 AM      Result Value Range Status   MRSA by PCR POSITIVE (*) NEGATIVE Final   Comment:  The GeneXpert MRSA Assay (FDA     approved for NASAL specimens      only), is one component of a     comprehensive MRSA colonization     surveillance program. It is not     intended to diagnose MRSA     infection nor to guide or     monitor treatment for     MRSA infections.     RESULT CALLED TO, READ BACK BY AND VERIFIED WITH:     CALLED TO RN Rocco PaulsARA TOMLINSON [phone]  THANEY  CLOSTRIDIUM DIFFICILE BY PCR     Status: None   Collection Time    02/05/13 10:51 AM      Result Value Range Status   C difficile by pcr NEGATIVE  NEGATIVE Final     Studies:  Recent x-ray studies have been reviewed in detail by the Attending Physician  Scheduled Meds:  Scheduled Meds: . antiseptic oral rinse  15 mL Mouth Rinse q12n4p  . benzonatate  100 mg Oral TID  . chlorhexidine  15 mL Mouth Rinse BID  . Chlorhexidine Gluconate Cloth  6 each Topical Q0600  . furosemide  40 mg Intravenous Q6H  . levofloxacin (LEVAQUIN) IV  750 mg Intravenous Q48H  . levothyroxine  50 mcg Oral QAC breakfast  . metoprolol  2.5 mg Intravenous Q6H  . metronidazole  500 mg Intravenous Q8H  . mupirocin ointment  1 application Nasal BID  . polyvinyl alcohol  2 drop Both Eyes TID  . sodium chloride  3 mL Intravenous Q12H  . vancomycin  500 mg Intravenous Q24H    Time spent on care of this patient: > 35 mins   Calvert CantorIZWAN,Wilbon Obenchain, MD  [facility] Office  [phone] Pager - Text Page per Loretha StaplerAmion as per below:  On-Call/Text Page:      [email or web address]      password [email or web address]  If 7PM-7AM, please contact night-coverage [email or web address] Password [email or web address] 02/07/2013, 2:30 PM   LOS: 3 days

## 2013-02-07 NOTE — Progress Notes (Signed)
CSW called pt's daughter Donna yesterday and left a voicemail confirming that [facility]. CSW continues to follow case and provide assistance.   Maryclare LabradorJulie Jackelyne Sayer, MSW, [facility] Clinical Social Worker [phone]

## 2013-02-07 NOTE — Progress Notes (Signed)
Palliative Medicine Team  I spoke at length with patient's daughter [name] and I also contacted patient's son [name]. There is a goals of care meeting tentively scheduled for 11AM-son [name] several hours away but willing to meet in person. [name] very much on board with comfort-[name] is having a much harder time-I have already introduced the concept of comfort care and possible Hospice. Ms. [name] is clearly agitated and I offered comfort and consideration of changing goals with more of that focus. [name] plans to communicate our preliminary conversation with her mother.  Valerie MaltaElizabeth Golding, DO Palliative Medicine

## 2013-02-08 ENCOUNTER — Inpatient Hospital Stay (HOSPITAL_COMMUNITY): Payer: Medicare Other

## 2013-02-08 DIAGNOSIS — Z66 Do not resuscitate: Secondary | ICD-10-CM | POA: Diagnosis not present

## 2013-02-08 DIAGNOSIS — Z515 Encounter for palliative care: Secondary | ICD-10-CM

## 2013-02-08 LAB — PREPARE FRESH FROZEN PLASMA
Unit division: 0
Unit division: 0
Unit division: 0

## 2013-02-08 LAB — BASIC METABOLIC PANEL
BUN: 59 mg/dL — AB (ref 6–23)
CALCIUM: 8.9 mg/dL (ref 8.4–10.5)
CO2: 30 mEq/L (ref 19–32)
CREATININE: 1.04 mg/dL (ref 0.50–1.10)
Chloride: 107 mEq/L (ref 96–112)
GFR calc Af Amer: 50 mL/min — ABNORMAL LOW (ref >90)
GFR, EST NON AFRICAN AMERICAN: 43 mL/min — AB (ref >90)
GLUCOSE: 117 mg/dL — AB (ref 70–99)
Potassium: 3 mEq/L — ABNORMAL LOW (ref 3.7–5.3)
SODIUM: 153 meq/L — AB (ref 137–147)

## 2013-02-08 LAB — CBC
HCT: 25.9 % — ABNORMAL LOW (ref 36.0–46.0)
Hemoglobin: 8.8 g/dL — ABNORMAL LOW (ref 12.0–15.0)
MCH: 31 pg (ref 26.0–34.0)
MCHC: 34 g/dL (ref 30.0–36.0)
MCV: 91.2 fL (ref 78.0–100.0)
PLATELETS: 221 10*3/uL (ref 150–400)
RBC: 2.84 MIL/uL — ABNORMAL LOW (ref 3.87–5.11)
RDW: 16.8 % — AB (ref 11.5–15.5)
WBC: 9.3 10*3/uL (ref 4.0–10.5)

## 2013-02-08 LAB — PROTIME-INR
INR: 1.58 — ABNORMAL HIGH (ref 0.00–1.49)
Prothrombin Time: 18.4 seconds — ABNORMAL HIGH (ref 11.6–15.2)

## 2013-02-08 MED ORDER — SODIUM CHLORIDE 0.9 % IV SOLN
1.0000 mg/h | INTRAVENOUS | Status: DC
  Administered 2013-02-08: 1 mg/h via INTRAVENOUS
  Administered 2013-02-09 (×2): 4 mg/h via INTRAVENOUS
  Filled 2013-02-08 (×2): qty 10

## 2013-02-08 MED ORDER — POTASSIUM CHLORIDE 10 MEQ/100ML IV SOLN: 10.0000 meq | INTRAVENOUS | Status: DC

## 2013-02-08 MED ORDER — LEVALBUTEROL HCL 0.63 MG/3ML IN NEBU
0.6300 mg | INHALATION_SOLUTION | Freq: Three times a day (TID) | RESPIRATORY_TRACT | Status: DC
  Administered 2013-02-08: 0.63 mg via RESPIRATORY_TRACT
  Filled 2013-02-08 (×3): qty 3

## 2013-02-08 MED ORDER — FUROSEMIDE 10 MG/ML IJ SOLN: 40.0000 mg | Freq: Two times a day (BID) | INTRAMUSCULAR | Status: DC

## 2013-02-08 MED ORDER — LEVOFLOXACIN 500 MG PO TABS: 500.0000 mg | ORAL_TABLET | Freq: Every day | ORAL | Status: DC

## 2013-02-08 MED ORDER — LORAZEPAM 2 MG/ML IJ SOLN
0.2500 mg | Freq: Two times a day (BID) | INTRAMUSCULAR | Status: DC
  Administered 2013-02-08 – 2013-02-09 (×4): 0.25 mg via INTRAVENOUS
  Filled 2013-02-08 (×4): qty 1

## 2013-02-08 MED ORDER — MORPHINE SULFATE 2 MG/ML IJ SOLN: 1.0000 mg | INTRAMUSCULAR | Status: DC | PRN

## 2013-02-08 MED ORDER — LEVOFLOXACIN 750 MG PO TABS
750.0000 mg | ORAL_TABLET | ORAL | Status: DC
  Filled 2013-02-08: qty 1

## 2013-02-08 MED ORDER — ATROPINE SULFATE 1 % OP SOLN
2.0000 [drp] | OPHTHALMIC | Status: DC | PRN
  Administered 2013-02-08 – 2013-02-09 (×9): 2 [drp] via SUBLINGUAL
  Filled 2013-02-08: qty 2

## 2013-02-08 MED ORDER — SODIUM CHLORIDE 0.9 % IV SOLN
INTRAVENOUS | Status: DC
  Administered 2013-02-09: 10:00:00 via INTRAVENOUS

## 2013-02-08 MED ORDER — FUROSEMIDE 10 MG/ML IJ SOLN
60.0000 mg | Freq: Two times a day (BID) | INTRAMUSCULAR | Status: DC
  Administered 2013-02-08 – 2013-02-09 (×3): 60 mg via INTRAVENOUS
  Filled 2013-02-08 (×4): qty 6

## 2013-02-08 MED ORDER — BISACODYL 10 MG RE SUPP: 10.0000 mg | Freq: Every day | RECTAL | Status: DC | PRN

## 2013-02-08 MED ORDER — LORAZEPAM 2 MG/ML IJ SOLN: 0.5000 mg | INTRAMUSCULAR | Status: DC | PRN

## 2013-02-08 MED ORDER — SENNOSIDES-DOCUSATE SODIUM 8.6-50 MG PO TABS: 1.0000 | ORAL_TABLET | Freq: Two times a day (BID) | ORAL | Status: DC

## 2013-02-08 MED ORDER — SCOPOLAMINE 1 MG/3DAYS TD PT72
1.0000 | MEDICATED_PATCH | TRANSDERMAL | Status: DC
  Administered 2013-02-08: 1.5 mg via TRANSDERMAL
  Filled 2013-02-08: qty 1

## 2013-02-08 NOTE — Consult Note (Signed)
[facility]all[facility] Medicine [facility] at [facility]  Date: 02/08/2013   Patient Name: [name]  DOB: 10/08/1911  MRN: [id number]  Age / Sex: 80 y.o., female   PCP: No Pcp Per Patient Referring Physician: Calvert CantorSaima Rizwan, MD  HPI/Reason for Consultation: 80 yo woman from [facility] admitted with AMS, respiratory failure and episode of aspiration of coffee ground emesis. On admission she was suctioned, placed on Bipap and has had an extremely aggressive treatment course including blood transfusions, IV antibiotics. Worsening agitation and chest congestion, CXR looks worse despite interventions. She has rhonchi and copious oral secretions. PMT asked to consult for goals of care.  Participants in Discussion: Patients three children [name], [name] and [name].  Goals/Summary of Discussion:  1. Code Status:  DNR  2. Scope of Treatment:  Comfort Care primary goal-QOL  No SDU or ICU level care-they desire a more "home like environment"- [facility]   Will maintain Protonix gtt for one more day to prevent upper GI re-bleed  Comfort feeding  No anticoagulation  If able to take PO can resumes BP meds  Allow for rest, minimize interventions  No blood tranfusions  3. Assessment/Plan:  Primary Diagnoses  1. Aspiration PNA 2. CHF, pulmonary edema 3. A-Fib, w/ RVR 4. Upper GI Bleed  Prognosis: hours-days PPS 30  Active Symptoms 1. Dyspnea 2. Abdominal Pain  4. Palliative Prophylaxis:   Bowel Regimen -yes  Terminal Secretions-yes  Breakthrough Pain and Dyspnea-yes   Agitation and Delirium-yes  Nausea-yes  5. Psychosocial Spiritual Asssessment/Interventions:  Patient and Family Adjustment to Illness/Prognosis: coping well, daughter [name] handling comfort decision better after getting specific medical information and reassurance  Spiritual Concerns or Needs: Chaplain consult   6. Disposition: I was very honest with this  family that their mother may not survive to leave the hospital-but that we would strive for moving her to a hospice facility which is what her daughters want most.  Educational Materials Given:  DNR: Yes  MOST: No Healthcare Power-of-Attorney: No   Time: 70 minutes Greater than 50%  of this time was spent counseling and coordinating care related to the above assessment and plan.  Signed by: Edsel PetrinElizabeth L Ziair Penson, DO  02/08/2013, 11:14 AM  Please contact Palliative Medicine Team phone at [phone] for questions and concerns.

## 2013-02-08 NOTE — Progress Notes (Signed)
[facility] TEAM 1 - Stepdown/ICU TEAM Progress Note  [name] ZOX:[id number] DOB: 1911-04-15 DOA: 01/12/2013 PCP: No PCP Per Patient  Admit HPI / Brief Narrative: 80 y.o. SNF resident female with history of atrial fib who was brought from the nursing home w/ c/o SOB.  She had been experiencing N/V/D and was observed to aspirate during an episode of vomiting.  She was also found to be in A. fib with RVR.  HPI/Subjective: Pt is alert and interactive, but continues to be confused.  Complains of a sore throat. Noted to have melena last. No c/o abdominal pain.    Assessment/Plan: Acute respiratory failure (a) Aspiration Pneumonitis  - RUL infiltrated on CXR today - still requiring 5 L O2 but pusle ox 99%- will try to taper O2 - add Flagyl to Vanc and Levaquin - per SLP will need honey thick liquids holding POs due to GI bleed- now on comfort feeds  (b) Pulmonary edema? Hx of Diastolic CHF - ECHO reveals clear cut right heart failure and although it mentions mod LVH, diastolic function is difficult to evalute - cont Lasix  Upper GI bleed - started on IV Protonix - f/u H/H closely- bleeding appears to have abated for now - transfused 1 U PRBC and 3 U FFP -  d/c'd Coumadin- Vit K given - no plans for scope per family  Hypokalemia - replace IV  Coagulopathy due to Coumadin - INR improved to 1.58  UTI - culture negative- on IV antibiotics already  Sepsis - due to PNA and UTI - resolving  N/V/D  - resolved - C diff negative  Chronic Afib w/ acute RVR - HR stable on IV Lopressor - On chronic warfarin which is on hold  Hypothyroidism - cont synthroid   MRSA Screen +  Code Status: DNR Family Communication: d/w daughter [name] and son Onalee HuaDavid on 1/30 Disposition Plan: SDU- palliative care consulted- d/c when hospice bed when available  Consultants: none  Procedures: none  Antibiotics: Zosyn 1/27 > 1/28 Vanc 1/27 > Levaquin 1/28 > Flagyl>> 1/29  DVT  prophylaxis: SCDs  Objective: Blood pressure 159/71, pulse 100, temperature 99.9 F (37.7 C), temperature source Core (Comment), resp. rate 14, height 5\' 5"  (1.651 m), weight 63.5 kg (139 lb 15.9 oz), SpO2 94.00%.  Intake/Output Summary (Last 24 hours) at 02/08/13 1747 Last data filed at 02/08/13 1653  Gross per 24 hour  Intake 4425.78 ml  Output   3325 ml  Net 1100.78 ml   Exam: General: awake and confused, No acute respiratory distress Lungs: cough, crackles at bases- upper airway congestion resolved for now Cardiovascular: Irregular rate and rhythm without murmur gallop or rub normal S1 and S2 Abdomen: Nontender, nondistended, soft, bowel sounds positive, no rebound, no ascites, no appreciable mass Extremities: No significant cyanosis, clubbing, or edema bilateral lower extremities   Data Reviewed: Basic Metabolic Panel:  Recent Labs Lab 02/05/2013 02/05/13 0013 02/05/13 0415 02/06/13 0240 02/07/13 0245 02/08/13 0501  NA 136* 138 139 136* 146 153*  K 4.8 4.4 4.9 5.0 4.5 3.0*  CL 97 101 100 100 105 107  CO2 21  --  21 22 25 30   GLUCOSE 214* 218* 167* 105* 101* 117*  BUN 24* 24* 28* 83* 79* 59*  CREATININE 0.77 0.80 1.04 1.24* 1.15* 1.04  CALCIUM 8.8  --  9.0 8.4 8.7 8.9   Liver Function Tests:  Recent Labs Lab 01/23/2013 02/06/13 0240  AST 20 19  ALT 10 11  ALKPHOS 70 42  BILITOT 0.4 0.3  PROT 7.2 6.2  ALBUMIN 2.6* 2.2*    Recent Labs Lab Feb 15, 2013  LIPASE 26   CBC:  Recent Labs Lab February 15, 2013  02/06/13 2113 02/07/13 0245 02/07/13 1202 02/07/13 2107 02/08/13 0501  WBC 9.3  < > 10.7* 8.0 10.3 10.2 9.3  NEUTROABS 8.7*  --   --   --   --   --   --   HGB 10.1*  < > 9.0* 8.1* 8.5* 8.8* 8.8*  HCT 30.5*  < > 26.1* 23.5* 25.0* 26.1* 25.9*  MCV 93.0  < > 89.7 89.7 89.9 90.0 91.2  PLT 283  < > 246 206 232 232 221  < > = values in this interval not displayed. Cardiac Enzymes:  Recent Labs Lab 02/05/13 0415 02/05/13 0915 02/05/13 1350  TROPONINI <0.30  <0.30 <0.30   BNP (last 3 results)  Recent Labs  11/16/12 0505 11/19/12 0505  PROBNP 2620.0* 3312.0*    Recent Results (from the past 240 hour(s))  CULTURE, BLOOD (ROUTINE X 2)     Status: None   Collection Time    02/15/2013 10:40 PM      Result Value Range Status   Specimen Description BLOOD ARM LEFT   Final   Special Requests BOTTLES DRAWN AEROBIC ONLY 6CC   Final   Culture  Setup Time     Final   Value: 02/05/2013 08:25     Performed at [facility]   Culture     Final   Value:        BLOOD CULTURE RECEIVED NO GROWTH TO DATE CULTURE WILL BE HELD FOR 5 DAYS BEFORE ISSUING A FINAL NEGATIVE REPORT     Performed at [facility]   Report Status PENDING   Incomplete  CULTURE, BLOOD (ROUTINE X 2)     Status: None   Collection Time    02-15-2013 10:45 PM      Result Value Range Status   Specimen Description BLOOD HAND LEFT   Final   Special Requests BOTTLES DRAWN AEROBIC ONLY 10CC   Final   Culture  Setup Time     Final   Value: 02/05/2013 08:25     Performed at [facility]   Culture     Final   Value:        BLOOD CULTURE RECEIVED NO GROWTH TO DATE CULTURE WILL BE HELD FOR 5 DAYS BEFORE ISSUING A FINAL NEGATIVE REPORT     Performed at [facility]   Report Status PENDING   Incomplete  URINE CULTURE     Status: None   Collection Time    2013/02/15 11:12 PM      Result Value Range Status   Specimen Description URINE, CATHETERIZED   Final   Special Requests Normal   Final   Culture  Setup Time     Final   Value: 02/05/2013 08:40     Performed at [facility] Count     Final   Value: NO GROWTH     Performed at [facility]   Culture     Final   Value: NO GROWTH     Performed at [facility]   Report Status 02/06/2013 FINAL   Final  MRSA PCR SCREENING     Status: Abnormal   Collection Time    02/05/13  2:14 AM      Result Value Range Status   MRSA by PCR POSITIVE (*) NEGATIVE  Final   Comment:             The GeneXpert MRSA Assay (FDA     approved for NASAL specimens     only), is one component of a     comprehensive MRSA colonization     surveillance program. It is not     intended to diagnose MRSA     infection nor to guide or     monitor treatment for     MRSA infections.     RESULT CALLED TO, READ BACK BY AND VERIFIED WITH:     CALLED TO RN Rocco Pauls [phone] @0634  THANEY  CLOSTRIDIUM DIFFICILE BY PCR     Status: None   Collection Time    02/05/13 10:51 AM      Result Value Range Status   C difficile by pcr NEGATIVE  NEGATIVE Final     Studies:  Recent x-ray studies have been reviewed in detail by the Attending Physician  Scheduled Meds:  Scheduled Meds: . antiseptic oral rinse  15 mL Mouth Rinse q12n4p  . furosemide  60 mg Intravenous BID  . levalbuterol  0.63 mg Nebulization Q8H  . [START ON 02/09/2013] levofloxacin  750 mg Oral Q48H  . LORazepam  0.25 mg Intravenous BID  . polyvinyl alcohol  2 drop Both Eyes TID  . scopolamine  1 patch Transdermal Q72H  . sodium chloride  3 mL Intravenous Q12H    Time spent on care of this patient: > 35 mins   Calvert Cantor, MD  [facility] Office  [phone] Pager - Text Page per Loretha Stapler as per below:  On-Call/Text Page:      [email or web address]      password [email or web address]  If 7PM-7AM, please contact night-coverage [email or web address] Password [email or web address] 02/08/2013, 5:47 PM   LOS: 4 days

## 2013-02-08 NOTE — Progress Notes (Signed)
Report called to Ladona Ridgelaylor RN on 6N and patients daughter [name] also called to let her know that patient is being transferred.

## 2013-02-09 DIAGNOSIS — I5033 Acute on chronic diastolic (congestive) heart failure: Secondary | ICD-10-CM

## 2013-02-09 DIAGNOSIS — Z66 Do not resuscitate: Secondary | ICD-10-CM

## 2013-02-09 MED ORDER — LEVALBUTEROL HCL 0.63 MG/3ML IN NEBU
0.6300 mg | INHALATION_SOLUTION | Freq: Three times a day (TID) | RESPIRATORY_TRACT | Status: DC
  Administered 2013-02-09 (×2): 0.63 mg via RESPIRATORY_TRACT
  Filled 2013-02-09 (×8): qty 3

## 2013-02-09 NOTE — Progress Notes (Signed)
[facility] PROGRESS NOTE  [name] VXY:[id number] DOB: 04-25-1911 DOA: 01/28/2013 PCP: No PCP Per Patient  Assessment/Plan:  *Acute respiratory failure  (a) Aspiration Pneumonitis  - RUL infiltrated on CXR today  - still requiring 5 L O2 but pusle ox 99%- will try to taper O2  - add Flagyl to Vanc and Levaquin  - per SLP will need honey thick liquids holding POs due to GI bleed- now on comfort feeds  Patient now started on morphine drip for comfort.  (b) Pulmonary edema? Hx of Diastolic CHF  - ECHO reveals clear cut right heart failure and although it mentions mod LVH, diastolic function is difficult to evalute  - cont Lasix   Upper GI bleed  - started on IV Protonix  - f/u H/H closely- bleeding appears to have abated for now  - transfused 1 U PRBC and 3 U FFP - d/c'd Coumadin- Vit K given  - no plans for scope per family   Sepsis  - due to PNA and UTI  - resolving   N/V/D  - resolved  - C diff negative   Chronic Afib w/ acute RVR  - HR stable on IV Lopressor  - On chronic warfarin which is on hold   Hypothyroidism  - cont synthroid   Goals of care  Palliative care team has met with the family and following is the scope of treatment: Comfort Care primary goal-QOL  No SDU or ICU level care-they desire a more "home like environment"- they specifically are asking for [facility] Place-comfort measures  Will maintain Protonix gtt for one more day to prevent upper GI re-bleed  Comfort feeding  No anticoagulation  If able to take PO can resumes BP meds  Allow for rest, minimize interventions  No blood tranfusions   Code Status: *DNR Family Communication: *No family  at bedside Disposition Plan: Residential hospice   Consultants:  Palliative care  Procedures:  None  Antibiotics: Zosyn 1/27 > 1/28  Vanc 1/27 >  Levaquin 1/28 >  Flagyl>> 1/29   HPI/Subjective: Patient is now unresponsive , started on morphine drip at 4 mg/hr per palliative  medicine team.  Objective: Filed Vitals:   02/09/13 0500  BP: 145/72  Pulse: 91  Temp: 98.9 F (37.2 C)  Resp: 15    Intake/Output Summary (Last 24 hours) at 02/09/13 1207 Last data filed at 02/09/13 0947  Gross per 24 hour  Intake    254 ml  Output   1440 ml  Net  -1186 ml   Filed Weights   02/05/13 0215 02/05/13 0402  Weight: 63.5 kg (139 lb 15.9 oz) 63.5 kg (139 lb 15.9 oz)    Exam:  Heent- Oral mucosa is moist Chest - B/L rhonchi Heart - S1s2 RRR Abdomen- Soft, nontender Neuro- unresponsive  Data Reviewed: Basic Metabolic Panel:  Recent Labs Lab 01/15/2013 02/05/13 0013 02/05/13 0415 02/06/13 0240 02/07/13 0245 02/08/13 0501  NA 136* 138 139 136* 146 153*  K 4.8 4.4 4.9 5.0 4.5 3.0*  CL 97 101 100 100 105 107  CO2 21  --  21 22 25 30   GLUCOSE 214* 218* 167* 105* 101* 117*  BUN 24* 24* 28* 83* 79* 59*  CREATININE 0.77 0.80 1.04 1.24* 1.15* 1.04  CALCIUM 8.8  --  9.0 8.4 8.7 8.9   Liver Function Tests:  Recent Labs Lab 01/23/2013 02/06/13 0240  AST 20 19  ALT 10 11  ALKPHOS 70 42  BILITOT 0.4 0.3  PROT 7.2  6.2  ALBUMIN 2.6* 2.2*    Recent Labs Lab 02/03/2013  LIPASE 26   No results found for this basename: AMMONIA,  in the last 168 hours CBC:  Recent Labs Lab 01/17/2013  02/06/13 2113 02/07/13 0245 02/07/13 1202 02/07/13 2107 02/08/13 0501  WBC 9.3  < > 10.7* 8.0 10.3 10.2 9.3  NEUTROABS 8.7*  --   --   --   --   --   --   HGB 10.1*  < > 9.0* 8.1* 8.5* 8.8* 8.8*  HCT 30.5*  < > 26.1* 23.5* 25.0* 26.1* 25.9*  MCV 93.0  < > 89.7 89.7 89.9 90.0 91.2  PLT 283  < > 246 206 232 232 221  < > = values in this interval not displayed. Cardiac Enzymes:  Recent Labs Lab 02/05/13 0415 02/05/13 0915 02/05/13 1350  TROPONINI <0.30 <0.30 <0.30   BNP (last 3 results)  Recent Labs  11/16/12 0505 11/19/12 0505  PROBNP 2620.0* 3312.0*   CBG: No results found for this basename: GLUCAP,  in the last 168 hours  Recent Results (from the past  240 hour(s))  CULTURE, BLOOD (ROUTINE X 2)     Status: None   Collection Time    01/13/2013 10:40 PM      Result Value Range Status   Specimen Description BLOOD ARM LEFT   Final   Special Requests BOTTLES DRAWN AEROBIC ONLY [place]   Final   Culture  Setup Time     Final   Value: 02/05/2013 08:25     Performed at [facility]   Culture     Final   Value:        BLOOD CULTURE RECEIVED NO GROWTH TO DATE CULTURE WILL BE HELD FOR 5 DAYS BEFORE ISSUING A FINAL NEGATIVE REPORT     Performed at [facility]   Report Status PENDING   Incomplete  CULTURE, BLOOD (ROUTINE X 2)     Status: None   Collection Time    01/15/2013 10:45 PM      Result Value Range Status   Specimen Description BLOOD HAND LEFT   Final   Special Requests BOTTLES DRAWN AEROBIC ONLY 10CC   Final   Culture  Setup Time     Final   Value: 02/05/2013 08:25     Performed at [facility]   Culture     Final   Value:        BLOOD CULTURE RECEIVED NO GROWTH TO DATE CULTURE WILL BE HELD FOR 5 DAYS BEFORE ISSUING A FINAL NEGATIVE REPORT     Performed at [facility]   Report Status PENDING   Incomplete  URINE CULTURE     Status: None   Collection Time    01/09/2013 11:12 PM      Result Value Range Status   Specimen Description URINE, CATHETERIZED   Final   Special Requests Normal   Final   Culture  Setup Time     Final   Value: 02/05/2013 08:40     Performed at [place]     Final   Value: NO GROWTH     Performed at [facility]   Culture     Final   Value: NO GROWTH     Performed at [facility]   Report Status 02/06/2013 FINAL   Final  MRSA PCR SCREENING     Status: Abnormal   Collection Time  02/05/13  2:14 AM      Result Value Range Status   MRSA by PCR POSITIVE (*) NEGATIVE Final   Comment:            The GeneXpert MRSA Assay (FDA     approved for NASAL specimens     only), is one component of a     comprehensive MRSA colonization      surveillance program. It is not     intended to diagnose MRSA     infection nor to guide or     monitor treatment for     MRSA infections.     RESULT CALLED TO, READ BACK BY AND VERIFIED WITH:     CALLED TO RN TARA TOMLINSON [phone] @0634  THANEY  CLOSTRIDIUM DIFFICILE BY PCR     Status: None   Collection Time    02/05/13 10:51 AM      Result Value Range Status   C difficile by pcr NEGATIVE  NEGATIVE Final     Studies: Dg Chest Port 1 View  02/08/2013   CLINICAL DATA:  Hypoxia.  Lung infiltrates.  EXAM: PORTABLE CHEST - 1 VIEW  COMPARISON:  DG CHEST 1V PORT dated 02/06/2013; DG CHEST 1V PORT dated 02/01/2013; DG CHEST 1V PORT dated 11/19/2012; DG CHEST 1V PORT dated 11/15/2012; DG ABD PORTABLE 1V dated 02/05/2013; CT CHEST W/O CM dated 03/24/2004  FINDINGS: The patient is rotated to the right on today's radiograph, reducing diagnostic sensitivity and specificity. Indistinct and likely increased airspace opacity in the right upper lobe. Enlarged cardiopericardial silhouette. Bilateral interstitial accentuation otherwise stable from prior.  Atherosclerotic aortic arch. Hiatal hernia noted. Bony demineralization is present.  IMPRESSION: 1. Suspected increased airspace opacity in the right upper lobe compatible with pneumonia or asymmetric confluent edema. 2. Stable abnormal interstitial accentuation in the lungs favoring edema. Cardiomegaly is present.   Electronically Signed   By: Sherryl Barters M.D.   On: 02/08/2013 08:13    Scheduled Meds: . antiseptic oral rinse  15 mL Mouth Rinse q12n4p  . furosemide  60 mg Intravenous BID  . levalbuterol  0.63 mg Nebulization TID  . levofloxacin  750 mg Oral Q48H  . LORazepam  0.25 mg Intravenous BID  . polyvinyl alcohol  2 drop Both Eyes TID  . scopolamine  1 patch Transdermal Q72H  . sodium chloride  3 mL Intravenous Q12H   Continuous Infusions: . sodium chloride 5 mL/hr at 02/08/13 0700  . sodium chloride 10 mL/hr at 02/09/13 0936  . morphine 4 mg/hr  (02/09/13 1024)    Principal Problem:   SIRS (systemic inflammatory response syndrome) Active Problems:   Atrial fibrillation   Urinary tract infection   GI bleed   DNR (do not resuscitate)   Comfort measures only status    Time spent: *25 min    [facility]  [facility] Pager [phone]*. If 7PM-7AM, please contact night-coverage at [email or web address], password [facility] 02/09/2013, 12:07 PM  LOS: 5 days

## 2013-02-09 NOTE — Progress Notes (Signed)
[facility] Progress Note   SUBJECTIVE: Unresponsive. Comfortable.  Interval Events: Transitioned to comfort 1/31.  OBJECTIVE: Vital Signs: BP 145/72  Pulse 91  Temp(Src) 98.9 F (37.2 C) (Axillary)  Resp 15  Ht 5\' 5"  (1.651 m)  Wt 63.5 kg (139 lb 15.9 oz)  BMI 23.30 kg/m2  SpO2 93%   Intake and Output: 01/31 0701 - 02/01 0700 In: 544.3 [I.V.:444.3; IV Piggyback:100] Out: 1440 [Urine:1440]  Physical Exam: General: Vital signs reviewed and noted. No distress.  Head: Normocephalic, atraumatic.  Lungs:  Rhonchi  Heart: Irregular  Abdomen:  Mildly distended, non-tender  Extremities: + edema.    Allergies  Allergen Reactions  . Boniva [Ibandronic Acid]     Per MAR  . Cymbalta [Duloxetine Hcl]     Per MAR  . Fentanyl     Per MAR  . Lyrica [Pregabalin]     Per MAR    Medications: Scheduled Meds:  . antiseptic oral rinse  15 mL Mouth Rinse q12n4p  . furosemide  60 mg Intravenous BID  . levalbuterol  0.63 mg Nebulization TID  . LORazepam  0.25 mg Intravenous BID  . polyvinyl alcohol  2 drop Both Eyes TID  . scopolamine  1 patch Transdermal Q72H  . sodium chloride  3 mL Intravenous Q12H    Continuous Infusions: . sodium chloride 5 mL/hr at 02/08/13 0700  . sodium chloride 10 mL/hr at 02/09/13 0936  . morphine 4 mg/hr (02/09/13 1024)    PRN Meds: sodium chloride, atropine, bisacodyl, LORazepam, morphine injection, ondansetron (ZOFRAN) IV, ondansetron  Stool Softner: YES  Palliative Performance Scale: 10 %   Labs: CBC    Component Value Date/Time   WBC 9.3 02/08/2013 0501   RBC 2.84* 02/08/2013 0501   RBC 3.22* 10/02/2006 0525   HGB 8.8* 02/08/2013 0501   HCT 25.9* 02/08/2013 0501   PLT 221 02/08/2013 0501   MCV 91.2 02/08/2013 0501   MCH 31.0 02/08/2013 0501   MCHC 34.0 02/08/2013 0501   RDW 16.8* 02/08/2013 0501   LYMPHSABS 0.3* 02/03/2013 0000   MONOABS 0.3 02/07/2013 0000   EOSABS 0.0 01/24/2013 0000   BASOSABS 0.0 01/13/2013  0000    CMET     Component Value Date/Time   NA 153* 02/08/2013 0501   K 3.0* 02/08/2013 0501   CL 107 02/08/2013 0501   CO2 30 02/08/2013 0501   GLUCOSE 117* 02/08/2013 0501   BUN 59* 02/08/2013 0501   CREATININE 1.04 02/08/2013 0501   CALCIUM 8.9 02/08/2013 0501   PROT 6.2 02/06/2013 0240   ALBUMIN 2.2* 02/06/2013 0240   AST 19 02/06/2013 0240   ALT 11 02/06/2013 0240   ALKPHOS 42 02/06/2013 0240   BILITOT 0.3 02/06/2013 0240   GFRNONAA 43* 02/08/2013 0501   GFRAA 50* 02/08/2013 0501     ASSESSMENT/ PLAN: 80 yo with aspiration PNA in setting of GIB. Actively dying. Required increase in morphine infusion. Family would like to have [facility]OL [facility] is there is a bed available and she is able to be safely and comfortably transported.   15 minutes.Greater than 50%  of this time was spent counseling and coordinating care related to the above assessment and plan.   Valerie PetrinElizabeth L Taraneh Metheney, DO  02/09/2013, 1:34 PM  Please contact Palliative Medicine Team phone at [phone] for questions and concerns.

## 2013-02-09 NOTE — Progress Notes (Signed)
Chaplain consulted with patient's nurse. Family in waiting area of unit. Chaplain offered emotional and spiritual support. Family grateful for visit and thanked chaplain for coming.   02/09/13 1500  Clinical Encounter Type  Visited With Family  Visit Type Initial

## 2013-02-09 DEATH — deceased

## 2013-02-11 LAB — CULTURE, BLOOD (ROUTINE X 2)
Culture: NO GROWTH
Culture: NO GROWTH

## 2013-03-09 NOTE — Discharge Summary (Signed)
Death Summary  [name] LKG:[id number] DOB: 1911-04-03 DOA: 02/20/2013  PCP: No PCP Per Patient PCP/Office notified:   Admit date: 02/20/13 Date of Death: 2013-03-07  Final Diagnoses:  Principal Problem:   SIRS (systemic inflammatory response syndrome) Active Problems:   Atrial fibrillation   Urinary tract infection   GI bleed   DNR (do not resuscitate)   Comfort measures only status   History of present illness:  80 y.o. female, nursing home resident with past medical history significant for atrial fib, GI bleed, who was brought from the nursing home after she had witnessed aspiration and hypoxemia also noted in the ER. Patient was placed on nasal cannula on admission and switched to BiPAP. Patient is DO NOT RESUSCITATE  [name] the patient she was not able to give any history. She was started on Cardizem drip due to A. fib with rapid ventricular rate   Hospital Course: Acute respiratory failure  (a) Aspiration Pneumonitis  Patient was found to have RUL infiltratre on the CXR, started on Flagyl, Vancomycin and Levaquin.Later after discussion with palliative care was made total comfort care. Patient was started on morphine drip.  (b) Pulmonary edema? Hx of Diastolic CHF  - ECHO revealed clear cut right heart failure and although it mentions mod LVH, diastolic function is difficult to evalute  Was started on lasix  Upper GI bleed  - started on IV Protonix  - transfused 1 U PRBC and 3 U FFP - d/c'd Coumadin- Vit K given  - no plans for scope per family   Sepsis  - due to PNA and UTI    N/V/D  - resolved  - C diff negative   Chronic Afib w/ acute RVR  - HR stable on IV Lopressor  - On chronic warfarin which was held.   Hypothyroidism  - cont synthroid   Goals of care  Palliative care team  met with the family and following is the scope of treatment:  Comfort Care primary goal-QOL  No SDU or ICU level care-they desire a more "home like environment"- they  specifically are asking for [facility]-comfort measures  Will maintain Protonix gtt for one more day to prevent upper GI re-bleed  Comfort feeding  No anticoagulation  If able to take PO can resumes BP meds  Allow for rest, minimize interventions  No blood tranfusions  Patient expired on 02-26-2013 at 4 pm.  Time: 35 min  Signed:  Hamp Moreland S  [facility] 2013-03-07, 9:53 AM

## 2013-03-09 NOTE — Progress Notes (Signed)
February 10, 2013 at 0400 Pt was found without no respiration, no pulse, pupils fully dilated. Pt pronounced expired by Jaci CarrelKokethia Ogun-Ajala and Gean MaidensFrances Pleasant, RN. [facility] on-call notified. Son, Ludwig ClarksDavid Tsosie and daughter, Isla PenceDonna Tyner notified. [facility] notified, spoke with Pasty [place], case [id number]. 90 mL of Morphine per IV infusion wasted in sink, witnessed by Neva SeatJilaine, Charity fundraiserN. Death certificate signed by Lenny Pastelom Callahan, NP.

## 2013-03-09 NOTE — Progress Notes (Signed)
Pt's clothes, 1 denture, and 2 rings on pt's left hand. All belongings sent to funeral home

## 2013-03-09 DEATH — deceased

## 2013-04-30 IMAGING — CR DG HIP W/ PELVIS BILAT
5 series · 5 of 5 positions shown · non-contrast
Comparison: 09/30/2006

CLINICAL DATA: Fall.  Pain

BILATERAL HIP WITH PELVIS - 4+ VIEW

[t pelvis a.p.]
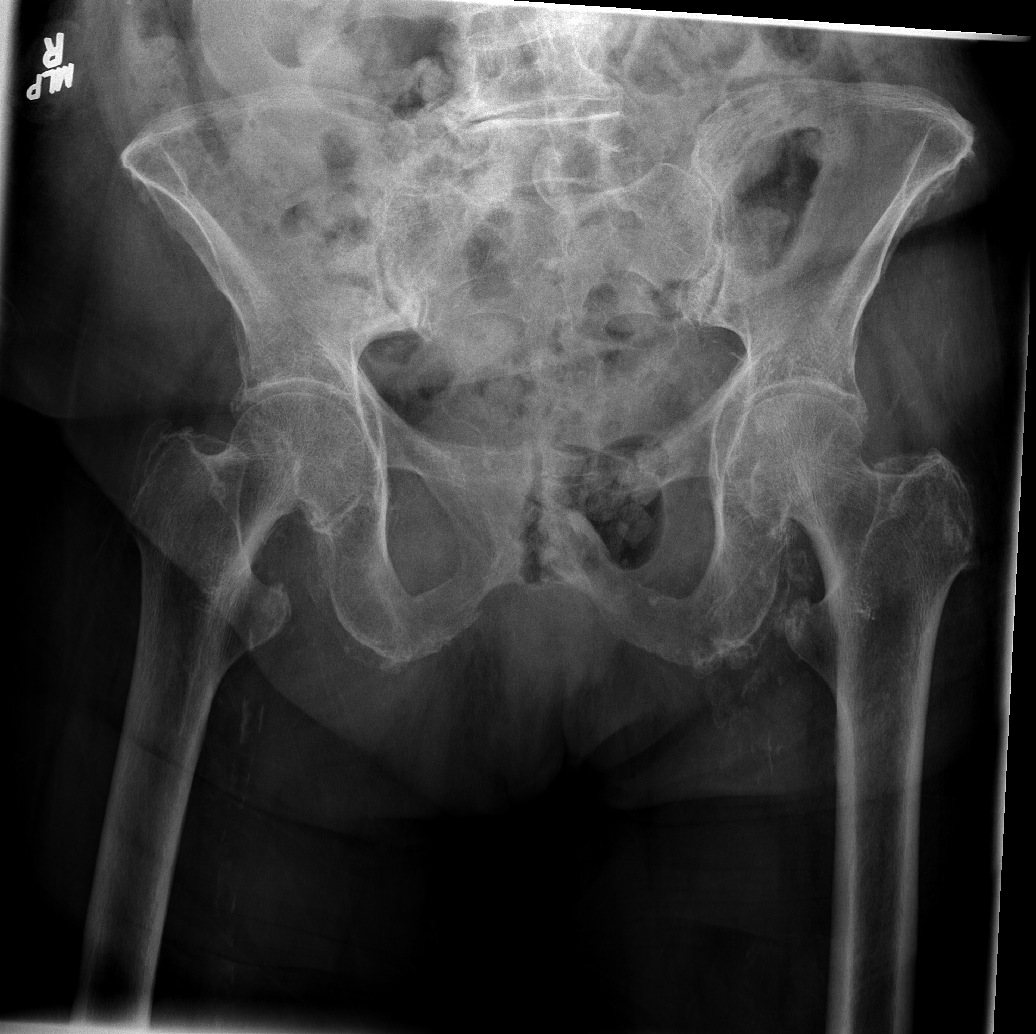

[t hip ap left]
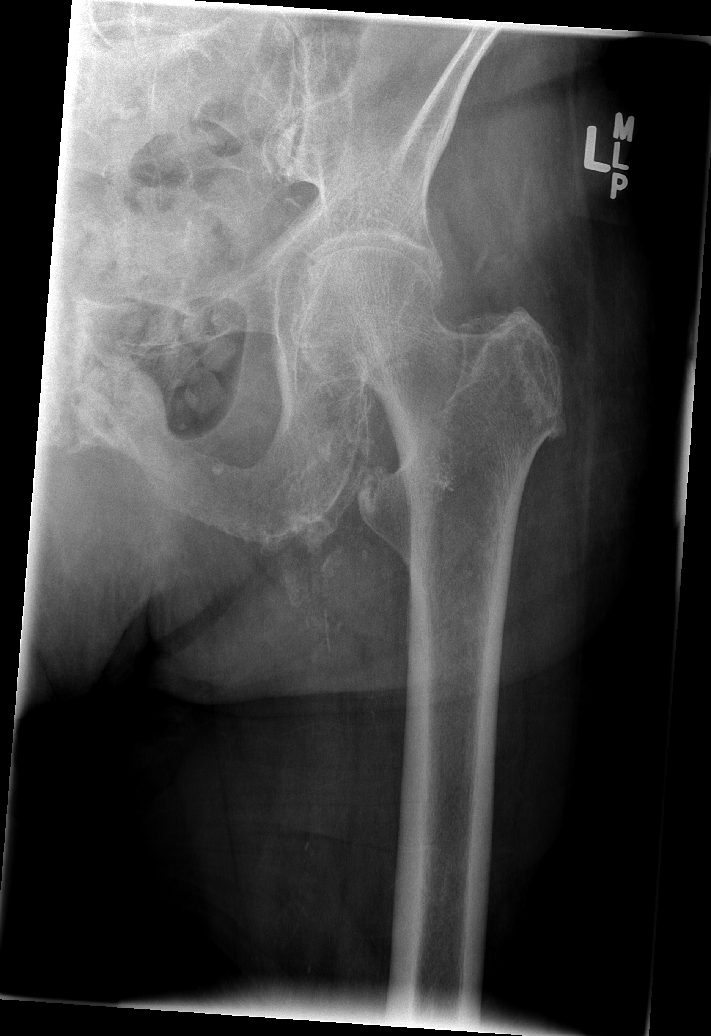

[t hip ap right]
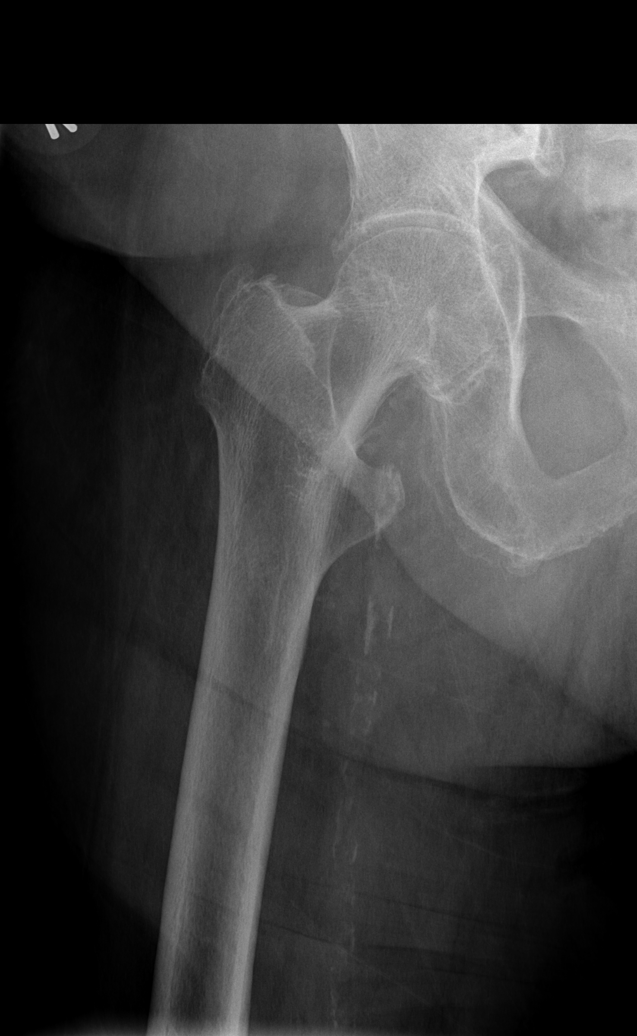

[t hip frog leg left]
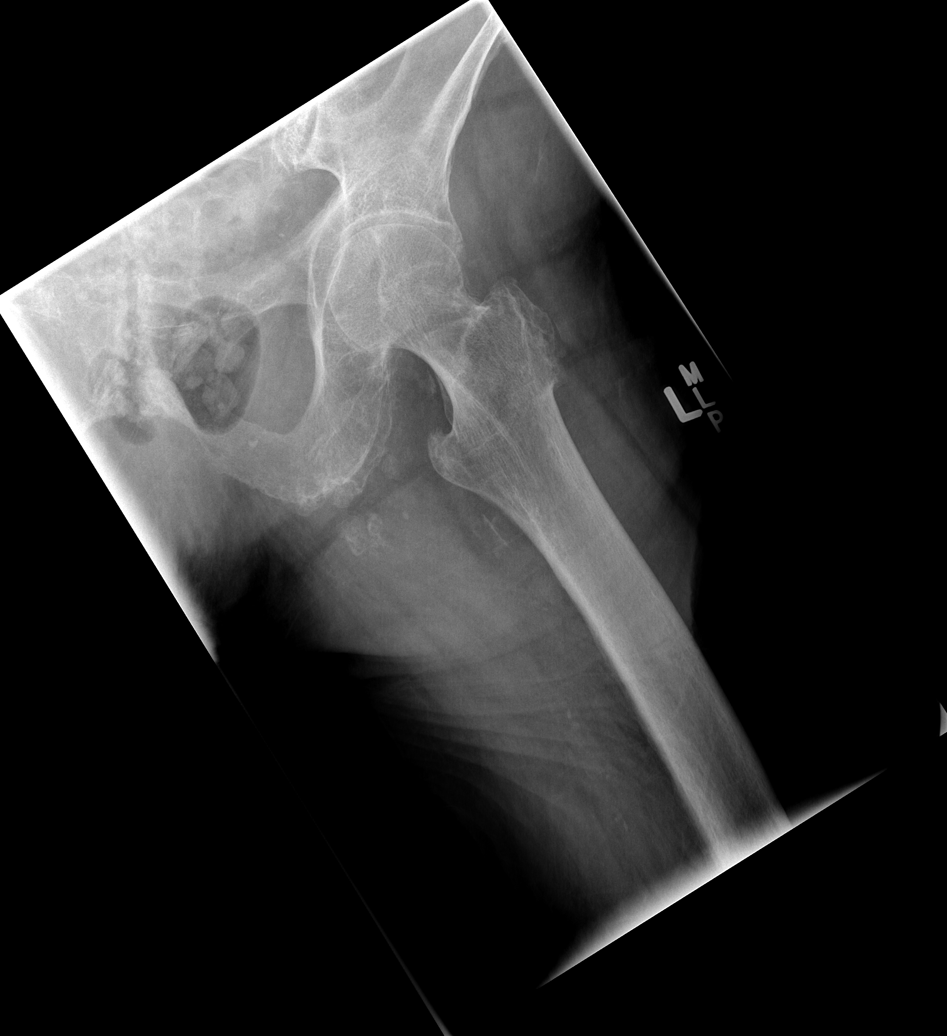

[t hip frog leg right]
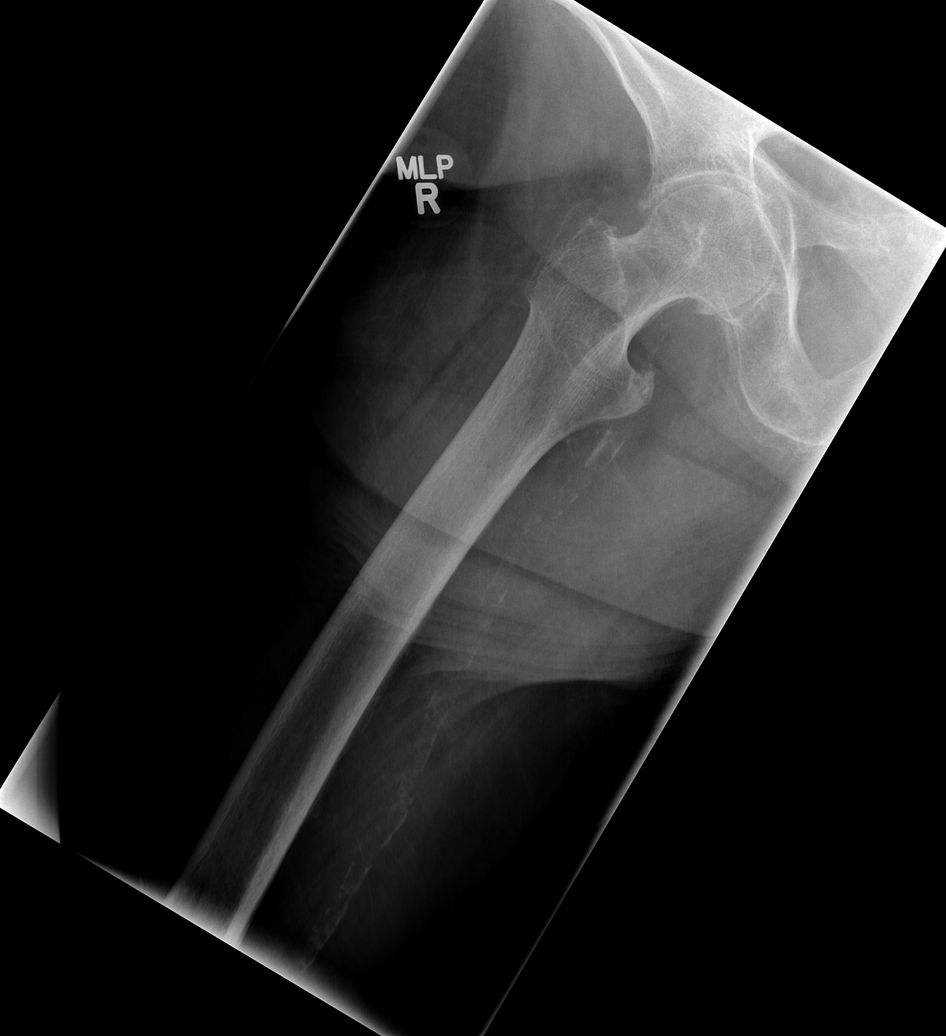

[5 of 5 positions shown; findings below may reference images not displayed]

FINDINGS: Both hips are in normal alignment.  Negative for fracture
in the femur or pelvis.  Mild degenerative change of both hip
joints.  Atherosclerotic disease.  Degenerative change in the
lumbar spine.
IMPRESSION: Negative for fracture.
